# Patient Record
Sex: Female | Born: 1970 | Race: Black or African American | Hispanic: No | Smoking: Never smoker
Health system: Southern US, Community
[De-identification: ages and names within clinical notes are randomized; demographics above are authoritative.]

## PROBLEM LIST (undated history)

## (undated) DIAGNOSIS — Z8489 Family history of other specified conditions: Secondary | ICD-10-CM

## (undated) DIAGNOSIS — E079 Disorder of thyroid, unspecified: Secondary | ICD-10-CM

## (undated) DIAGNOSIS — R7303 Prediabetes: Secondary | ICD-10-CM

## (undated) DIAGNOSIS — E669 Obesity, unspecified: Secondary | ICD-10-CM

## (undated) DIAGNOSIS — Z87442 Personal history of urinary calculi: Secondary | ICD-10-CM

## (undated) DIAGNOSIS — R112 Nausea with vomiting, unspecified: Secondary | ICD-10-CM

## (undated) DIAGNOSIS — E119 Type 2 diabetes mellitus without complications: Secondary | ICD-10-CM

## (undated) DIAGNOSIS — E785 Hyperlipidemia, unspecified: Secondary | ICD-10-CM

## (undated) DIAGNOSIS — G43909 Migraine, unspecified, not intractable, without status migrainosus: Secondary | ICD-10-CM

## (undated) DIAGNOSIS — M1990 Unspecified osteoarthritis, unspecified site: Secondary | ICD-10-CM

## (undated) DIAGNOSIS — T8859XA Other complications of anesthesia, initial encounter: Secondary | ICD-10-CM

## (undated) DIAGNOSIS — K649 Unspecified hemorrhoids: Secondary | ICD-10-CM

## (undated) DIAGNOSIS — I10 Essential (primary) hypertension: Secondary | ICD-10-CM

## (undated) DIAGNOSIS — E559 Vitamin D deficiency, unspecified: Secondary | ICD-10-CM

## (undated) DIAGNOSIS — R5383 Other fatigue: Secondary | ICD-10-CM

## (undated) DIAGNOSIS — R609 Edema, unspecified: Secondary | ICD-10-CM

## (undated) DIAGNOSIS — K219 Gastro-esophageal reflux disease without esophagitis: Secondary | ICD-10-CM

## (undated) DIAGNOSIS — Z9889 Other specified postprocedural states: Secondary | ICD-10-CM

## (undated) DIAGNOSIS — T4145XA Adverse effect of unspecified anesthetic, initial encounter: Secondary | ICD-10-CM

## (undated) HISTORY — DX: Obesity, unspecified: E66.9

## (undated) HISTORY — DX: Edema, unspecified: R60.9

## (undated) HISTORY — DX: Hyperlipidemia, unspecified: E78.5

## (undated) HISTORY — PX: NASAL SINUS SURGERY: SHX719

## (undated) HISTORY — DX: Gastro-esophageal reflux disease without esophagitis: K21.9

## (undated) HISTORY — PX: TUBAL LIGATION: SHX77

## (undated) HISTORY — PX: VAGINAL HYSTERECTOMY: SUR661

## (undated) HISTORY — DX: Other fatigue: R53.83

## (undated) HISTORY — DX: Essential (primary) hypertension: I10

## (undated) HISTORY — DX: Vitamin D deficiency, unspecified: E55.9

## (undated) HISTORY — PX: CHOLECYSTECTOMY: SHX55

## (undated) HISTORY — PX: TONSILLECTOMY: SUR1361

## (undated) HISTORY — PX: NASAL SEPTUM SURGERY: SHX37

---
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
## 2005-01-12 ENCOUNTER — Emergency Department: Payer: Self-pay | Admitting: Emergency Medicine

## 2005-03-09 ENCOUNTER — Ambulatory Visit: Payer: Self-pay | Admitting: *Deleted

## 2005-10-15 ENCOUNTER — Ambulatory Visit: Payer: Self-pay | Admitting: Gynecology

## 2006-04-08 ENCOUNTER — Ambulatory Visit: Payer: Self-pay | Admitting: Gynecology

## 2007-03-14 ENCOUNTER — Ambulatory Visit: Payer: Self-pay | Admitting: Family Medicine

## 2008-03-29 ENCOUNTER — Ambulatory Visit: Payer: Self-pay

## 2008-12-10 ENCOUNTER — Ambulatory Visit: Payer: Self-pay | Admitting: Gastroenterology

## 2010-05-18 ENCOUNTER — Ambulatory Visit: Payer: Self-pay | Admitting: *Deleted

## 2011-05-15 ENCOUNTER — Ambulatory Visit: Payer: Self-pay

## 2011-07-17 ENCOUNTER — Ambulatory Visit: Payer: Self-pay

## 2012-07-09 ENCOUNTER — Ambulatory Visit: Payer: Self-pay | Admitting: Physician Assistant

## 2012-11-17 ENCOUNTER — Emergency Department: Payer: Self-pay | Admitting: Emergency Medicine

## 2012-11-17 LAB — CBC
HCT: 44 % (ref 35.0–47.0)
MCH: 28.6 pg (ref 26.0–34.0)
MCHC: 33.9 g/dL (ref 32.0–36.0)
MCV: 85 fL (ref 80–100)
Platelet: 285 10*3/uL (ref 150–440)
RBC: 5.2 10*6/uL (ref 3.80–5.20)
RDW: 14 % (ref 11.5–14.5)

## 2012-11-17 LAB — COMPREHENSIVE METABOLIC PANEL
Albumin: 3.9 g/dL (ref 3.4–5.0)
Alkaline Phosphatase: 56 U/L (ref 50–136)
Anion Gap: 2 — ABNORMAL LOW (ref 7–16)
BUN: 17 mg/dL (ref 7–18)
Bilirubin,Total: 0.4 mg/dL (ref 0.2–1.0)
Chloride: 100 mmol/L (ref 98–107)
Co2: 32 mmol/L (ref 21–32)
Creatinine: 0.95 mg/dL (ref 0.60–1.30)
EGFR (African American): >60
EGFR (Non-African Amer.): >60
Glucose: 128 mg/dL — ABNORMAL HIGH (ref 65–99)
SGOT(AST): 8 U/L — ABNORMAL LOW (ref 15–37)
SGPT (ALT): 17 U/L (ref 12–78)
Sodium: 134 mmol/L — ABNORMAL LOW (ref 136–145)
Total Protein: 7.2 g/dL (ref 6.4–8.2)

## 2012-11-17 LAB — URINALYSIS, COMPLETE
Bilirubin,UR: NEGATIVE
Blood: NEGATIVE
Glucose,UR: NEGATIVE mg/dL (ref 0–75)
Nitrite: NEGATIVE
Ph: 7 (ref 4.5–8.0)
Protein: NEGATIVE
Specific Gravity: 1.011 (ref 1.003–1.030)
WBC UR: 1 /HPF (ref 0–5)

## 2012-11-17 LAB — TSH: Thyroid Stimulating Horm: 1.25 u[IU]/mL

## 2012-11-17 LAB — TROPONIN I: Troponin-I: <0.02 ng/mL

## 2013-08-19 ENCOUNTER — Ambulatory Visit: Payer: Self-pay | Admitting: Physician Assistant

## 2013-09-23 DIAGNOSIS — J309 Allergic rhinitis, unspecified: Secondary | ICD-10-CM | POA: Insufficient documentation

## 2014-08-06 ENCOUNTER — Other Ambulatory Visit: Payer: Self-pay | Admitting: Internal Medicine

## 2014-08-06 DIAGNOSIS — Z1231 Encounter for screening mammogram for malignant neoplasm of breast: Secondary | ICD-10-CM

## 2014-08-24 ENCOUNTER — Ambulatory Visit: Payer: Self-pay | Attending: Internal Medicine

## 2014-12-30 ENCOUNTER — Ambulatory Visit: Payer: Self-pay | Admitting: Gastroenterology

## 2015-01-05 ENCOUNTER — Other Ambulatory Visit: Payer: Self-pay

## 2015-01-05 DIAGNOSIS — R739 Hyperglycemia, unspecified: Secondary | ICD-10-CM | POA: Insufficient documentation

## 2015-01-05 DIAGNOSIS — I159 Secondary hypertension, unspecified: Secondary | ICD-10-CM

## 2015-01-05 DIAGNOSIS — K219 Gastro-esophageal reflux disease without esophagitis: Secondary | ICD-10-CM

## 2015-01-05 DIAGNOSIS — I10 Essential (primary) hypertension: Secondary | ICD-10-CM | POA: Insufficient documentation

## 2015-01-05 DIAGNOSIS — R5382 Chronic fatigue, unspecified: Secondary | ICD-10-CM

## 2015-01-05 DIAGNOSIS — E669 Obesity, unspecified: Secondary | ICD-10-CM

## 2015-01-05 DIAGNOSIS — E785 Hyperlipidemia, unspecified: Secondary | ICD-10-CM

## 2015-01-05 DIAGNOSIS — R609 Edema, unspecified: Secondary | ICD-10-CM

## 2015-01-05 DIAGNOSIS — E559 Vitamin D deficiency, unspecified: Secondary | ICD-10-CM

## 2015-01-05 DIAGNOSIS — R5383 Other fatigue: Secondary | ICD-10-CM

## 2015-01-05 HISTORY — DX: Obesity, unspecified: E66.9

## 2015-01-05 HISTORY — DX: Edema, unspecified: R60.9

## 2015-01-05 HISTORY — DX: Vitamin D deficiency, unspecified: E55.9

## 2015-01-05 HISTORY — DX: Hyperlipidemia, unspecified: E78.5

## 2015-01-05 HISTORY — DX: Other fatigue: R53.83

## 2015-01-05 HISTORY — DX: Gastro-esophageal reflux disease without esophagitis: K21.9

## 2015-01-05 HISTORY — DX: Essential (primary) hypertension: I10

## 2015-01-06 ENCOUNTER — Encounter: Payer: Self-pay | Admitting: Gastroenterology

## 2015-01-06 ENCOUNTER — Other Ambulatory Visit: Payer: Self-pay

## 2015-01-06 ENCOUNTER — Ambulatory Visit (INDEPENDENT_AMBULATORY_CARE_PROVIDER_SITE_OTHER): Payer: 59 | Admitting: Gastroenterology

## 2015-01-06 ENCOUNTER — Encounter (INDEPENDENT_AMBULATORY_CARE_PROVIDER_SITE_OTHER): Payer: Self-pay

## 2015-01-06 ENCOUNTER — Ambulatory Visit: Payer: Self-pay | Admitting: Gastroenterology

## 2015-01-06 VITALS — BP 120/59 | HR 90 | Temp 98.5°F | Ht 60.0 in | Wt 234.0 lb

## 2015-01-06 DIAGNOSIS — R748 Abnormal levels of other serum enzymes: Secondary | ICD-10-CM

## 2015-01-06 DIAGNOSIS — R17 Unspecified jaundice: Secondary | ICD-10-CM

## 2015-01-17 NOTE — Progress Notes (Signed)
Office visit cancelled

## 2015-01-18 ENCOUNTER — Telehealth: Payer: Self-pay

## 2015-01-18 NOTE — Telephone Encounter (Signed)
LVM on cell her labs were normal. Advised to call back with any questions.

## 2015-03-21 ENCOUNTER — Ambulatory Visit (INDEPENDENT_AMBULATORY_CARE_PROVIDER_SITE_OTHER): Payer: 59

## 2015-03-21 ENCOUNTER — Ambulatory Visit
Admission: EM | Admit: 2015-03-21 | Discharge: 2015-03-21 | Disposition: A | Discharge status: Home / Self Care | Payer: 59 | Attending: Family Medicine | Admitting: Family Medicine

## 2015-03-21 DIAGNOSIS — K529 Noninfective gastroenteritis and colitis, unspecified: Secondary | ICD-10-CM

## 2015-03-21 HISTORY — DX: Disorder of thyroid, unspecified: E07.9

## 2015-03-21 LAB — COMPREHENSIVE METABOLIC PANEL
ALT: 14 U/L (ref 14–54)
AST: 15 U/L (ref 15–41)
Albumin: 4.3 g/dL (ref 3.5–5.0)
Alkaline Phosphatase: 55 U/L (ref 38–126)
Anion gap: 10 (ref 5–15)
BUN: 12 mg/dL (ref 6–20)
CO2: 29 mmol/L (ref 22–32)
Calcium: 10.2 mg/dL (ref 8.9–10.3)
Chloride: 97 mmol/L — ABNORMAL LOW (ref 101–111)
Creatinine, Ser: 0.81 mg/dL (ref 0.44–1.00)
GFR calc Af Amer: >60 mL/min (ref >60)
GFR calc non Af Amer: >60 mL/min (ref >60)
Glucose, Bld: 103 mg/dL — ABNORMAL HIGH (ref 65–99)
Potassium: 3.9 mmol/L (ref 3.5–5.1)
Sodium: 136 mmol/L (ref 135–145)
Total Bilirubin: 0.5 mg/dL (ref 0.3–1.2)
Total Protein: 7.3 g/dL (ref 6.5–8.1)

## 2015-03-21 LAB — CBC WITH DIFFERENTIAL/PLATELET
Basophils Absolute: 0.1 10*3/uL (ref 0–0.1)
Basophils Relative: 1 %
Eosinophils Absolute: 0.1 10*3/uL (ref 0–0.7)
Eosinophils Relative: 1 %
HCT: 44.4 % (ref 35.0–47.0)
Hemoglobin: 14.8 g/dL (ref 12.0–16.0)
Lymphocytes Relative: 28 %
Lymphs Abs: 2.1 10*3/uL (ref 1.0–3.6)
MCH: 28.6 pg (ref 26.0–34.0)
MCHC: 33.2 g/dL (ref 32.0–36.0)
MCV: 85.9 fL (ref 80.0–100.0)
Monocytes Absolute: 0.5 10*3/uL (ref 0.2–0.9)
Monocytes Relative: 6 %
Neutro Abs: 4.8 10*3/uL (ref 1.4–6.5)
Neutrophils Relative %: 64 %
Platelets: 270 10*3/uL (ref 150–440)
RBC: 5.17 MIL/uL (ref 3.80–5.20)
RDW: 13.9 % (ref 11.5–14.5)
WBC: 7.5 10*3/uL (ref 3.6–11.0)

## 2015-03-21 LAB — URINALYSIS COMPLETE WITH MICROSCOPIC (ARMC ONLY)
BILIRUBIN URINE: NEGATIVE
Glucose, UA: NEGATIVE mg/dL
KETONES UR: NEGATIVE mg/dL
Leukocytes, UA: NEGATIVE
Nitrite: NEGATIVE
PROTEIN: NEGATIVE mg/dL
Specific Gravity, Urine: >1.03 — ABNORMAL HIGH (ref 1.005–1.030)
pH: 5 (ref 5.0–8.0)

## 2015-03-21 MED ORDER — ONDANSETRON 4 MG PO TBDP
4.0000 mg | ORAL_TABLET | Freq: Three times a day (TID) | ORAL | Status: DC | PRN
Start: 2015-03-21 — End: 2016-07-12

## 2015-03-21 MED ORDER — ONDANSETRON 8 MG PO TBDP
8.0000 mg | ORAL_TABLET | Freq: Once | ORAL | Status: AC
  Administered 2015-03-21: 8 mg via ORAL

## 2015-03-21 NOTE — ED Notes (Signed)
Started 2 days before Thanksgiving with upper abdominal pain and BM's "after eating, goes right through". + nausea and yesterday began vomiting bile-vomited 5 times. Still remains nauseated.

## 2015-03-21 NOTE — ED Provider Notes (Signed)
CSN: [id number]     Arrival date & time 03/21/15  1347 History   None    Chief Complaint  Patient presents with  . Abdominal Pain   (Consider location/radiation/quality/duration/timing/severity/associated sxs/prior Treatment) HPI Comments: 44 yo female with a 6 days h/o upper abdominal pains and loose stools after eating, associated with nausea and vomiting 5 times yesterday. Denies any fevers, chills, chest pains, shortness of breath, melena, hematochezia.   The history is provided by the patient.    Past Medical History  Diagnosis Date  . Hypertension 01/05/2015  . GERD (gastroesophageal reflux disease) 01/05/2015  . Hyperlipidemia 01/05/2015  . Fatigue 01/05/2015  . Vitamin D deficiency 01/05/2015  . Obesity 01/05/2015  . Edema 01/05/2015  . Thyroid disease    Past Surgical History  Procedure Laterality Date  . Vaginal hysterectomy    . Nasal septum surgery    . Tonsillectomy    . Cholecystectomy    . Nasal sinus surgery     Family History  Problem Relation Age of Onset  . Cancer Mother   . Diabetes Mother   . Hypertension Father   . Heart disease Father   . Diabetes Sister   . Diabetes Brother   . Lupus Sister    Social History  Substance Use Topics  . Smoking status: Never Smoker   . Smokeless tobacco: Never Used  . Alcohol Use: 0.0 oz/week    0 Standard drinks or equivalent per week     Comment: rare consumption   OB History    No data available     Review of Systems  Allergies  Sulfa antibiotics  Home Medications   Prior to Admission medications   Medication Sig Start Date End Date Taking? Authorizing Provider  Cholecalciferol (VITAMIN D3) 1000 UNITS CAPS Take by mouth. 05/11/14  Yes Historical Provider, MD  esomeprazole (NEXIUM) 40 MG capsule Take by mouth.   Yes Historical Provider, MD  etodolac (LODINE) 500 MG tablet Take by mouth. 10/07/14  Yes Historical Provider, MD  hydrochlorothiazide (HYDRODIURIL) 50 MG tablet Take by mouth. 03/25/14  Yes  Historical Provider, MD  ibuprofen (ADVIL,MOTRIN) 200 MG tablet Take by mouth.   Yes Historical Provider, MD  levothyroxine (SYNTHROID, LEVOTHROID) 75 MCG tablet TK 1 T PO QD IN THE MORNING 11/16/14  Yes Historical Provider, MD  metFORMIN (GLUCOPHAGE) 500 MG tablet TK 1 T PO BID 11/16/14  Yes Historical Provider, MD  acetaminophen (TYLENOL) 500 MG tablet Take by mouth.    Historical Provider, MD  DHA-EPA-VITAMIN E PO Take by mouth. 01/27/14   Historical Provider, MD  fexofenadine (WAL-FEX ALLERGY) 180 MG tablet Take by mouth. 01/28/14   Historical Provider, MD  ondansetron (ZOFRAN ODT) 4 MG disintegrating tablet Take 1 tablet (4 mg total) by mouth every 8 (eight) hours as needed for nausea or vomiting. 03/21/15   Lutricia FeilWilliam P Roemer, PA-C   Meds Ordered and Administered this Visit   Medications  ondansetron (ZOFRAN-ODT) disintegrating tablet 8 mg (8 mg Oral Given 03/21/15 1630)    BP 130/93 mmHg  Pulse 96  Temp(Src) 97.8 F (36.6 C) (Tympanic)  Resp 16  Ht 5' (1.524 m)  Wt 240 lb (108.863 kg)  BMI 46.87 kg/m2  SpO2 100% No data found.   Physical Exam  Constitutional: She appears well-developed and well-nourished. No distress.  Cardiovascular: Normal rate, regular rhythm and normal heart sounds.   Pulmonary/Chest: Effort normal and breath sounds normal. No respiratory distress.  Abdominal: Soft. Bowel sounds are normal. She exhibits  no distension and no mass. There is tenderness (mild diffuse tenderness to palpation; no rebound or guarding; no masses). There is no rebound and no guarding.  Skin: No rash noted. She is not diaphoretic.  Nursing note and vitals reviewed.   ED Course  Procedures (including critical care time)  Labs Review Labs Reviewed  URINALYSIS COMPLETEWITH MICROSCOPIC ([facility] ONLY) - Abnormal; Notable for the following:    APPearance HAZY (*)    Specific Gravity, Urine >1.030 (*)    Hgb urine dipstick 2+ (*)    Bacteria, UA FEW (*)    Squamous Epithelial / LPF  6-30 (*)    All other components within normal limits  COMPREHENSIVE METABOLIC PANEL - Abnormal; Notable for the following:    Chloride 97 (*)    Glucose, Bld 103 (*)    All other components within normal limits  CBC WITH DIFFERENTIAL/PLATELET    Imaging Review No results found.   Visual Acuity Review  Right Eye Distance:   Left Eye Distance:   Bilateral Distance:    Right Eye Near:   Left Eye Near:    Bilateral Near:         MDM   1. Gastroenteritis, acute    Discharge Medication List as of 03/21/2015  5:05 PM    START taking these medications   Details  ondansetron (ZOFRAN ODT) 4 MG disintegrating tablet Take 1 tablet (4 mg total) by mouth every 8 (eight) hours as needed for nausea or vomiting., Starting 03/21/2015, Until Discontinued, Print       1. Lab results and diagnosis reviewed with patient 2. rx as per orders above; reviewed possible side effects, interactions, risks and benefits  3. Recommend supportive treatment with increased fluids/clear liquid diet and advance slowly as tolerated 4. Follow-up prn if symptoms worsen or don't improve    Payton Mccallum, MD 04/14/15 1651

## 2015-03-21 NOTE — Discharge Instructions (Signed)

## 2015-03-21 NOTE — ED Provider Notes (Signed)
CSN: [id number]     Arrival date & time 03/21/15  1347 History   First MD Initiated Contact with Patient 03/21/15 1557     Chief Complaint  Patient presents with  . Abdominal Pain   (Consider location/radiation/quality/duration/timing/severity/associated sxs/prior Treatment) HPI   A 44 year old female who presents with four-day history of upper abdominal pain and diarrhea. He started vomiting yesterday for 5 times. He states that the day before Thanksgiving she ate [place] food about 12 hours later began to have diarrhea is described as watery yellow and malodorous. She states that every time she tries to eat solid food she is tense to have a bowel movement. She doesn't have specific abdominal pain and has more cramping in quality prior to her bowel movement. She denies any blood or mucus in her diarrhea. She has not had any recent antibiotic therapy. She states she just does not feel well. Past abdominal surgical history includes an appendectomy hysterectomy and cholecystectomy. She denies any fever or chills. He states that she is usually able to keep liquids down but solid food is the problem.  Past Medical History  Diagnosis Date  . Hypertension 01/05/2015  . GERD (gastroesophageal reflux disease) 01/05/2015  . Hyperlipidemia 01/05/2015  . Fatigue 01/05/2015  . Vitamin D deficiency 01/05/2015  . Obesity 01/05/2015  . Edema 01/05/2015  . Thyroid disease    Past Surgical History  Procedure Laterality Date  . Vaginal hysterectomy    . Nasal septum surgery    . Tonsillectomy    . Cholecystectomy    . Nasal sinus surgery     Family History  Problem Relation Age of Onset  . Cancer Mother   . Diabetes Mother   . Hypertension Father   . Heart disease Father   . Diabetes Sister   . Diabetes Brother   . Lupus Sister    Social History  Substance Use Topics  . Smoking status: Never Smoker   . Smokeless tobacco: Never Used  . Alcohol Use: 0.0 oz/week    0 Standard drinks or equivalent  per week     Comment: rare consumption   OB History    No data available     Review of Systems  Constitutional: Positive for activity change and appetite change. Negative for fever, chills, diaphoresis and fatigue.  Gastrointestinal: Positive for nausea, vomiting, abdominal pain and diarrhea. Negative for constipation, blood in stool and abdominal distention.  Genitourinary: Negative for dysuria, urgency, frequency, hematuria, flank pain, vaginal bleeding and vaginal discharge.  All other systems reviewed and are negative.   Allergies  Sulfa antibiotics  Home Medications   Prior to Admission medications   Medication Sig Start Date End Date Taking? Authorizing Provider  Cholecalciferol (VITAMIN D3) 1000 UNITS CAPS Take by mouth. 05/11/14  Yes Historical Provider, MD  esomeprazole (NEXIUM) 40 MG capsule Take by mouth.   Yes Historical Provider, MD  etodolac (LODINE) 500 MG tablet Take by mouth. 10/07/14  Yes Historical Provider, MD  hydrochlorothiazide (HYDRODIURIL) 50 MG tablet Take by mouth. 03/25/14  Yes Historical Provider, MD  ibuprofen (ADVIL,MOTRIN) 200 MG tablet Take by mouth.   Yes Historical Provider, MD  levothyroxine (SYNTHROID, LEVOTHROID) 75 MCG tablet TK 1 T PO QD IN THE MORNING 11/16/14  Yes Historical Provider, MD  metFORMIN (GLUCOPHAGE) 500 MG tablet TK 1 T PO BID 11/16/14  Yes Historical Provider, MD  acetaminophen (TYLENOL) 500 MG tablet Take by mouth.    Historical Provider, MD  DHA-EPA-VITAMIN E PO Take by mouth.  01/27/14   Historical Provider, MD  fexofenadine (WAL-FEX ALLERGY) 180 MG tablet Take by mouth. 01/28/14   Historical Provider, MD  ondansetron (ZOFRAN ODT) 4 MG disintegrating tablet Take 1 tablet (4 mg total) by mouth every 8 (eight) hours as needed for nausea or vomiting. 03/21/15   Lutricia Feil, PA-C   Meds Ordered and Administered this Visit   Medications  ondansetron (ZOFRAN-ODT) disintegrating tablet 8 mg (8 mg Oral Given 03/21/15 1630)    BP  130/93 mmHg  Pulse 96  Temp(Src) 97.8 F (36.6 C) (Tympanic)  Resp 16  Ht 5' (1.524 m)  Wt 240 lb (108.863 kg)  BMI 46.87 kg/m2  SpO2 100% No data found.   Physical Exam  Constitutional: She is oriented to person, place, and time. She appears well-developed and well-nourished. No distress.  HENT:  Head: Normocephalic and atraumatic.  Eyes: Conjunctivae are normal. Pupils are equal, round, and reactive to light. Right eye exhibits no discharge. Left eye exhibits no discharge.  Neck: Neck supple.  Pulmonary/Chest: Breath sounds normal. No respiratory distress. She has no wheezes. She has no rales.  Abdominal: Soft. Bowel sounds are normal. She exhibits no distension. There is tenderness. There is no rebound and no guarding.  Juanna Cao chaperoned. No distension. NBS present. Very diffuse tenderness to deep palpation. No rebound or guarding. Zofran ODT helped nausea  Musculoskeletal: Normal range of motion. She exhibits no edema or tenderness.  Neurological: She is alert and oriented to person, place, and time.  Skin: Skin is warm and dry. She is not diaphoretic.  Psychiatric: She has a normal mood and affect. Her behavior is normal. Judgment and thought content normal.    ED Course  Procedures (including critical care time)  Labs Review Labs Reviewed  URINALYSIS COMPLETEWITH MICROSCOPIC ([facility] ONLY) - Abnormal; Notable for the following:    APPearance HAZY (*)    Specific Gravity, Urine >1.030 (*)    Hgb urine dipstick 2+ (*)    Bacteria, UA FEW (*)    Squamous Epithelial / LPF 6-30 (*)    All other components within normal limits  COMPREHENSIVE METABOLIC PANEL - Abnormal; Notable for the following:    Chloride 97 (*)    Glucose, Bld 103 (*)    All other components within normal limits  CBC WITH DIFFERENTIAL/PLATELET    Imaging Review Dg Abd Acute W/chest  03/21/2015  CLINICAL DATA:  Pain in LUQ x 1 week with diarrhea and n/v and bloating. Hx gastritis and GERD.  Surgeries, appendectomy, Cholecystectomy, hysterectomy EXAM: DG ABDOMEN ACUTE W/ 1V CHEST COMPARISON:  05/15/2011 FINDINGS: Normal bowel gas pattern with no bowel dilation to suggest obstruction or generalized adynamic ileus. No air-fluid levels. There is no free air. Surgical vascular clips in the right upper quadrant reflect a prior cholecystectomy. No evidence of renal or ureteral stones. Soft tissues are otherwise unremarkable. Normal heart, mediastinum and hila.  Clear lungs. IMPRESSION: 1. No acute findings. No evidence of bowel obstruction or generalized adynamic ileus. No free air. 2. Normal frontal chest radiograph. Electronically Signed   By: Amie Portland M.D.   On: 03/21/2015 16:48     Visual Acuity Review  Right Eye Distance:   Left Eye Distance:   Bilateral Distance:    Right Eye Near:   Left Eye Near:    Bilateral Near:         MDM   1. Gastroenteritis, acute    Discharge Medication List as of 03/21/2015  5:05 PM  START taking these medications   Details  ondansetron (ZOFRAN ODT) 4 MG disintegrating tablet Take 1 tablet (4 mg total) by mouth every 8 (eight) hours as needed for nausea or vomiting., Starting 03/21/2015, Until Discontinued, Print      Plan: 1. Test/x-ray results and diagnosis reviewed with patient 2. rx as per orders; risks, benefits, potential side effects reviewed with patient 3. Recommend supportive treatment with Fluids rest. Follow BRAT diet when starting food intake 4. F/u  With PCP next week or go to ED if worsening   Lutricia FeilWilliam P Roemer, PA-C 03/22/15 1706   Lutricia FeilWilliam P Roemer, PA-C 03/22/15 1715

## 2015-05-30 ENCOUNTER — Other Ambulatory Visit: Payer: Self-pay | Admitting: Internal Medicine

## 2015-05-30 ENCOUNTER — Ambulatory Visit
Admission: RE | Admit: 2015-05-30 | Discharge: 2015-05-30 | Disposition: A | Discharge status: Home / Self Care | Payer: 59 | Source: Ambulatory Visit | Attending: Internal Medicine | Admitting: Internal Medicine

## 2015-05-30 DIAGNOSIS — R109 Unspecified abdominal pain: Secondary | ICD-10-CM | POA: Diagnosis present

## 2015-05-30 DIAGNOSIS — M545 Low back pain: Secondary | ICD-10-CM

## 2015-05-30 DIAGNOSIS — N83202 Unspecified ovarian cyst, left side: Secondary | ICD-10-CM | POA: Insufficient documentation

## 2015-05-30 DIAGNOSIS — Z90721 Acquired absence of ovaries, unilateral: Secondary | ICD-10-CM | POA: Insufficient documentation

## 2015-05-30 DIAGNOSIS — Z90710 Acquired absence of both cervix and uterus: Secondary | ICD-10-CM | POA: Diagnosis not present

## 2015-06-21 ENCOUNTER — Other Ambulatory Visit: Payer: Self-pay | Admitting: Internal Medicine

## 2015-06-21 DIAGNOSIS — R1031 Right lower quadrant pain: Secondary | ICD-10-CM

## 2015-06-26 ENCOUNTER — Emergency Department: Payer: 59

## 2015-06-26 ENCOUNTER — Emergency Department
Admission: EM | Admit: 2015-06-26 | Discharge: 2015-06-26 | Disposition: A | Discharge status: Home / Self Care | Payer: 59 | Attending: Emergency Medicine | Admitting: Emergency Medicine

## 2015-06-26 ENCOUNTER — Other Ambulatory Visit: Payer: Self-pay

## 2015-06-26 DIAGNOSIS — E669 Obesity, unspecified: Secondary | ICD-10-CM | POA: Diagnosis not present

## 2015-06-26 DIAGNOSIS — M79604 Pain in right leg: Secondary | ICD-10-CM | POA: Insufficient documentation

## 2015-06-26 DIAGNOSIS — I10 Essential (primary) hypertension: Secondary | ICD-10-CM | POA: Insufficient documentation

## 2015-06-26 DIAGNOSIS — R079 Chest pain, unspecified: Secondary | ICD-10-CM | POA: Diagnosis present

## 2015-06-26 DIAGNOSIS — Z7984 Long term (current) use of oral hypoglycemic drugs: Secondary | ICD-10-CM | POA: Insufficient documentation

## 2015-06-26 DIAGNOSIS — M79605 Pain in left leg: Secondary | ICD-10-CM | POA: Insufficient documentation

## 2015-06-26 DIAGNOSIS — Z791 Long term (current) use of non-steroidal anti-inflammatories (NSAID): Secondary | ICD-10-CM | POA: Diagnosis not present

## 2015-06-26 DIAGNOSIS — M79622 Pain in left upper arm: Secondary | ICD-10-CM | POA: Diagnosis not present

## 2015-06-26 DIAGNOSIS — Z79899 Other long term (current) drug therapy: Secondary | ICD-10-CM | POA: Diagnosis not present

## 2015-06-26 LAB — TROPONIN I

## 2015-06-26 LAB — BASIC METABOLIC PANEL
ANION GAP: 7 (ref 5–15)
BUN: 22 mg/dL — ABNORMAL HIGH (ref 6–20)
CALCIUM: 9.7 mg/dL (ref 8.9–10.3)
CO2: 27 mmol/L (ref 22–32)
Chloride: 103 mmol/L (ref 101–111)
Creatinine, Ser: 0.81 mg/dL (ref 0.44–1.00)
Glucose, Bld: 99 mg/dL (ref 65–99)
POTASSIUM: 3.8 mmol/L (ref 3.5–5.1)
SODIUM: 137 mmol/L (ref 135–145)

## 2015-06-26 LAB — CBC
HEMATOCRIT: 40.9 % (ref 35.0–47.0)
HEMOGLOBIN: 13.8 g/dL (ref 12.0–16.0)
MCH: 28.6 pg (ref 26.0–34.0)
MCHC: 33.6 g/dL (ref 32.0–36.0)
MCV: 85.1 fL (ref 80.0–100.0)
Platelets: 254 10*3/uL (ref 150–440)
RBC: 4.8 MIL/uL (ref 3.80–5.20)
RDW: 13.7 % (ref 11.5–14.5)
WBC: 6.6 10*3/uL (ref 3.6–11.0)

## 2015-06-26 MED ORDER — MORPHINE SULFATE (PF) 4 MG/ML IV SOLN
4.0000 mg | Freq: Once | INTRAVENOUS | Status: AC
  Administered 2015-06-26: 4 mg via INTRAVENOUS
  Filled 2015-06-26: qty 1

## 2015-06-26 MED ORDER — IOHEXOL 350 MG/ML SOLN
75.0000 mL | Freq: Once | INTRAVENOUS | Status: AC | PRN
Start: 2015-06-26 — End: 2015-06-26
  Administered 2015-06-26: 75 mL via INTRAVENOUS

## 2015-06-26 MED ORDER — SODIUM CHLORIDE 0.9 % IV BOLUS (SEPSIS)
500.0000 mL | INTRAVENOUS | Status: AC
  Administered 2015-06-26: 500 mL via INTRAVENOUS

## 2015-06-26 MED ORDER — ONDANSETRON HCL 4 MG/2ML IJ SOLN
4.0000 mg | Freq: Once | INTRAMUSCULAR | Status: AC
  Administered 2015-06-26: 4 mg via INTRAVENOUS
  Filled 2015-06-26: qty 2

## 2015-06-26 MED ORDER — ASPIRIN 81 MG PO CHEW
162.0000 mg | CHEWABLE_TABLET | Freq: Once | ORAL | Status: AC
  Administered 2015-06-26: 162 mg via ORAL

## 2015-06-26 MED ORDER — ASPIRIN 81 MG PO CHEW
324.0000 mg | CHEWABLE_TABLET | Freq: Once | ORAL | Status: DC
  Filled 2015-06-26: qty 4

## 2015-06-26 NOTE — ED Notes (Signed)
Discussed discharge instructions and follow-up care with patient. No questions or concerns at this time. Pt stable at discharge.  

## 2015-06-26 NOTE — Discharge Instructions (Signed)
You have been seen in the Emergency Department (ED) today for chest pain.  As we have discussed todays test results are normal, but you may require further testing.  Please follow up with the recommended doctor as instructed above in these documents (tomorrow at 2 pm) regarding todays emergent visit and your recent symptoms to discuss further management.  Continue to take your regular medications. If you are not doing so already, please also take a daily baby aspirin (81 mg), at least until you follow up with your doctor.  Return to the Emergency Department (ED) if you experience any further chest pain/pressure/tightness, difficulty breathing, or sudden sweating, or other symptoms that concern you.   Chest Pain (Nonspecific) It is often hard to give a specific diagnosis for the cause of chest pain. There is always a chance that your pain could be related to something serious, such as a heart attack or a blood clot in the lungs. You need to follow up with your health care provider for further evaluation. CAUSES   Heartburn.  Pneumonia or bronchitis.  Anxiety or stress.  Inflammation around your heart (pericarditis) or lung (pleuritis or pleurisy).  A blood clot in the lung.  A collapsed lung (pneumothorax). It can develop suddenly on its own (spontaneous pneumothorax) or from trauma to the chest.  Shingles infection (herpes zoster virus). The chest wall is composed of bones, muscles, and cartilage. Any of these can be the source of the pain.  The bones can be bruised by injury.  The muscles or cartilage can be strained by coughing or overwork.  The cartilage can be affected by inflammation and become sore (costochondritis). DIAGNOSIS  Lab tests or other studies may be needed to find the cause of your pain. Your health care provider may have you take a test called an ambulatory electrocardiogram (ECG). An ECG records your heartbeat patterns over a 24-hour period. You may also have  other tests, such as:  Transthoracic echocardiogram (TTE). During echocardiography, sound waves are used to evaluate how blood flows through your heart.  Transesophageal echocardiogram (TEE).  Cardiac monitoring. This allows your health care provider to monitor your heart rate and rhythm in real time.  Holter monitor. This is a portable device that records your heartbeat and can help diagnose heart arrhythmias. It allows your health care provider to track your heart activity for several days, if needed.  Stress tests by exercise or by giving medicine that makes the heart beat faster. TREATMENT   Treatment depends on what may be causing your chest pain. Treatment may include:  Acid blockers for heartburn.  Anti-inflammatory medicine.  Pain medicine for inflammatory conditions.  Antibiotics if an infection is present.  You may be advised to change lifestyle habits. This includes stopping smoking and avoiding alcohol, caffeine, and chocolate.  You may be advised to keep your head raised (elevated) when sleeping. This reduces the chance of acid going backward from your stomach into your esophagus. Most of the time, nonspecific chest pain will improve within 2-3 days with rest and mild pain medicine.  HOME CARE INSTRUCTIONS   If antibiotics were prescribed, take them as directed. Finish them even if you start to feel better.  For the next few days, avoid physical activities that bring on chest pain. Continue physical activities as directed.  Do not use any tobacco products, including cigarettes, chewing tobacco, or electronic cigarettes.  Avoid drinking alcohol.  Only take medicine as directed by your health care provider.  Follow your health  care provider's suggestions for further testing if your chest pain does not go away.  Keep any follow-up appointments you made. If you do not go to an appointment, you could develop lasting (chronic) problems with pain. If there is any  problem keeping an appointment, call to reschedule. SEEK MEDICAL CARE IF:   Your chest pain does not go away, even after treatment.  You have a rash with blisters on your chest.  You have a fever. SEEK IMMEDIATE MEDICAL CARE IF:   You have increased chest pain or pain that spreads to your arm, neck, jaw, back, or abdomen.  You have shortness of breath.  You have an increasing cough, or you cough up blood.  You have severe back or abdominal pain.  You feel nauseous or vomit.  You have severe weakness.  You faint.  You have chills. This is an emergency. Do not wait to see if the pain will go away. Get medical help at once. Call your local emergency services (911 in [place] Do not drive yourself to the hospital. MAKE SURE YOU:   Understand these instructions.  Will watch your condition.  Will get help right away if you are not doing well or get worse. Document Released: 01/17/2005 Document Revised: 04/14/2013 Document Reviewed: 11/13/2007 [facility] Patient Information 2015 [place], [place]. This information is not intended to replace advice given to you by your health care provider. Make sure you discuss any questions you have with your health care provider.

## 2015-06-26 NOTE — ED Notes (Signed)
Patient transported to Ultrasound 

## 2015-06-26 NOTE — ED Notes (Signed)
Patient transported to CT 

## 2015-06-26 NOTE — ED Provider Notes (Signed)
[facility] Emergency Department Provider Note  ____________________________________________  Time seen: Approximately 4:03 AM  I have reviewed the triage vital signs and the nursing notes.   HISTORY  Chief Complaint Chest Pain    HPI Tamara Flowers is a 45 y.o. female with a PMH of obesity, HTN, HLD, and acid reflux who presents with acute onset of moderate left sided chest pain radiating to her back and down left arm.  She reports she was asleep and that the pain woke her up.  It has been constant since it started a few hours ago.  Nothing makes it better nor worse.  It is sharp and stabbing.   Never had similar symptoms.  Denies N/V, abd pain, SOB.  Has some focal swelling in left upper arm just above elbow on the medial side which is new.  No exogenous estrogen.  No history of blood clots.  Father had early onset ACS/CAD and died from a heart attack.  No personal history of CAD.   Past Medical History  Diagnosis Date  . Hypertension 01/05/2015  . GERD (gastroesophageal reflux disease) 01/05/2015  . Hyperlipidemia 01/05/2015  . Fatigue 01/05/2015  . Vitamin D deficiency 01/05/2015  . Obesity 01/05/2015  . Edema 01/05/2015  . Thyroid disease     Patient Active Problem List   Diagnosis Date Noted  . Hyperglycemia 01/05/2015  . Hyperlipidemia 01/05/2015  . Hypertension 01/05/2015  . Fatigue 01/05/2015  . Vitamin D deficiency 01/05/2015  . GERD (gastroesophageal reflux disease) 01/05/2015  . Obesity 01/05/2015  . Edema 01/05/2015    Past Surgical History  Procedure Laterality Date  . Vaginal hysterectomy    . Nasal septum surgery    . Tonsillectomy    . Cholecystectomy    . Nasal sinus surgery      Current Outpatient Rx  Name  Route  Sig  Dispense  Refill  . acetaminophen (TYLENOL) 500 MG tablet   Oral   Take by mouth.         . Cholecalciferol (VITAMIN D3) 1000 UNITS CAPS   Oral   Take by mouth.         . DHA-EPA-VITAMIN E PO    Oral   Take by mouth.         . esomeprazole (NEXIUM) 40 MG capsule   Oral   Take by mouth.         . etodolac (LODINE) 500 MG tablet   Oral   Take by mouth.         . fexofenadine (WAL-FEX ALLERGY) 180 MG tablet   Oral   Take by mouth.         . hydrochlorothiazide (HYDRODIURIL) 50 MG tablet   Oral   Take by mouth.         Marland Kitchen ibuprofen (ADVIL,MOTRIN) 200 MG tablet   Oral   Take by mouth.         . levothyroxine (SYNTHROID, LEVOTHROID) 75 MCG tablet      TK 1 T PO QD IN THE MORNING      3   . metFORMIN (GLUCOPHAGE) 500 MG tablet      TK 1 T PO BID      1   . ondansetron (ZOFRAN ODT) 4 MG disintegrating tablet   Oral   Take 1 tablet (4 mg total) by mouth every 8 (eight) hours as needed for nausea or vomiting.   20 tablet   0     Allergies Sulfa antibiotics  Family History  Problem Relation Age of Onset  . Cancer Mother   . Diabetes Mother   . Hypertension Father   . Heart disease Father   . Diabetes Sister   . Diabetes Brother   . Lupus Sister     Social History Social History  Substance Use Topics  . Smoking status: Never Smoker   . Smokeless tobacco: Never Used  . Alcohol Use: 0.0 oz/week    0 Standard drinks or equivalent per week     Comment: rare consumption    Review of Systems Constitutional: No fever/chills Eyes: No visual changes. ENT: No sore throat. Cardiovascular: Moderate L sided chest pain radiating to back and left arm Respiratory: Denies shortness of breath. Gastrointestinal: No abdominal pain.  No nausea, no vomiting.  No diarrhea.  No constipation. Genitourinary: Negative for dysuria. Musculoskeletal: Pain in left upper arm.  Some mild bilateral leg pain with ambulation, but not like the arm discomfort. Skin: Negative for rash. Neurological: Negative for headaches, focal weakness or numbness.  10-point ROS otherwise negative.  ____________________________________________   PHYSICAL EXAM:  VITAL  SIGNS: ED Triage Vitals  Enc Vitals Group     BP 06/26/15 0045 127/87 mmHg     Pulse Rate 06/26/15 0045 97     Resp 06/26/15 0045 22     Temp 06/26/15 0045 98.3 F (36.8 C)     Temp Source 06/26/15 0045 Oral     SpO2 06/26/15 0045 100 %     Weight 06/26/15 0043 230 lb (104.327 kg)     Height 06/26/15 0043 5' (1.524 m)     Head Cir --      Peak Flow --      Pain Score 06/26/15 0043 6     Pain Loc --      Pain Edu? --      Excl. in GC? --     Constitutional: Alert and oriented. No severe distress, but appears uncomfortable. Eyes: Conjunctivae are normal. PERRL. EOMI. Head: Atraumatic. Nose: No congestion/rhinnorhea. Mouth/Throat: Mucous membranes are moist.  Oropharynx non-erythematous. Neck: No stridor.   Cardiovascular: Normal rate, regular rhythm. Grossly normal heart sounds.  Good peripheral circulation. No reproducible chest wall tenderness Respiratory: Normal respiratory effort.  No retractions. Lungs CTAB. Gastrointestinal: Obese, Soft and nontender. No distention. No abdominal bruits. No CVA tenderness. Musculoskeletal: Focal area of swelling and tenderness on her left upper extremity just proximal to the elbow on the medial side, does not appear consistent with abscess or infectious process.  No lower extremity pitting edema.  No significant tenderness of the lower extremity. Neurologic:  Normal speech and language. No gross focal neurologic deficits are appreciated.  Skin:  Skin is warm, dry and intact. No rash noted. Psychiatric: Mood and affect are normal. Speech and behavior are normal.  ____________________________________________   LABS (all labs ordered are listed, but only abnormal results are displayed)  Labs Reviewed  BASIC METABOLIC PANEL - Abnormal; Notable for the following:    BUN 22 (*)    All other components within normal limits  CBC  TROPONIN I  TROPONIN I   ____________________________________________  EKG  ED ECG REPORT I, Gurnoor Ursua,  the attending physician, personally viewed and interpreted this ECG.  Date: 06/26/2015 EKG Time: 00:48 Rate: 96 Rhythm: normal sinus rhythm QRS Axis: normal Intervals: normal ST/T Wave abnormalities: normal Conduction Disturbances: none Narrative Interpretation: unremarkable  ____________________________________________  RADIOLOGY   Dg Chest 2 View  06/26/2015  CLINICAL DATA:  Acute onset  of left-sided chest pain, radiating to the left jaw and left arm. Initial encounter. EXAM: CHEST  2 VIEW COMPARISON:  Chest radiograph performed 03/21/2015 FINDINGS: The lungs are well-aerated and clear. There is no evidence of focal opacification, pleural effusion or pneumothorax. The heart is normal in size; the mediastinal contour is within normal limits. No acute osseous abnormalities are seen. Clips are noted within the right upper quadrant, reflecting prior cholecystectomy. IMPRESSION: No acute cardiopulmonary process seen. Electronically Signed   By: Roanna Raider M.D.   On: 06/26/2015 01:24   Ct Angio Chest Pe W/cm &/or Wo Cm  06/26/2015  CLINICAL DATA:  Acute onset of left-sided chest pain, radiating to the jaw and left arm. Initial encounter. EXAM: CT ANGIOGRAPHY CHEST WITH CONTRAST TECHNIQUE: Multidetector CT imaging of the chest was performed using the standard protocol during bolus administration of intravenous contrast. Multiplanar CT image reconstructions and MIPs were obtained to evaluate the vascular anatomy. CONTRAST:  75mL OMNIPAQUE IOHEXOL 350 MG/ML SOLN COMPARISON:  Chest radiograph performed earlier today at 12:27 a.m. FINDINGS: There is no evidence of pulmonary embolus. Minimal bibasilar atelectasis is noted. The lungs are otherwise clear. There is no evidence of significant focal consolidation, pleural effusion or pneumothorax. No masses are identified; no abnormal focal contrast enhancement is seen. The mediastinum is unremarkable in appearance. No mediastinal lymphadenopathy is seen. No  pericardial effusion is identified. The great vessels are grossly unremarkable, aside from mild ectasia of the right innominate artery. No axillary lymphadenopathy is seen. The visualized portions of the thyroid gland are unremarkable in appearance. The visualized portions of the liver and spleen are unremarkable. The patient is status post cholecystectomy, with clips noted at the gallbladder fossa. The visualized portions of the pancreas, right adrenal gland and stomach are grossly unremarkable. No acute osseous abnormalities are seen. Review of the MIP images confirms the above findings. IMPRESSION: 1. No evidence of pulmonary embolus. 2. Minimal bibasilar atelectasis noted.  Lungs otherwise clear. Electronically Signed   By: Roanna Raider M.D.   On: 06/26/2015 05:00   US Venous Img Upper Uni Left  06/26/2015  CLINICAL DATA:  Pain and swelling about the left upper arm, acute onset. Initial encounter. EXAM: LEFT UPPER EXTREMITY VENOUS DOPPLER ULTRASOUND TECHNIQUE: Gray-scale sonography with graded compression, as well as color Doppler and duplex ultrasound were performed to evaluate the upper extremity deep venous system from the level of the subclavian vein and including the jugular, axillary, basilic, radial, ulnar and upper cephalic vein. Spectral Doppler was utilized to evaluate flow at rest and with distal augmentation maneuvers. COMPARISON:  None. FINDINGS: Contralateral Subclavian Vein: Respiratory phasicity is normal and symmetric with the symptomatic side. No evidence of thrombus. Normal compressibility. Internal Jugular Vein: No evidence of thrombus. Normal compressibility, respiratory phasicity and response to augmentation. Subclavian Vein: No evidence of thrombus. Normal compressibility, respiratory phasicity and response to augmentation. Axillary Vein: No evidence of thrombus. Normal compressibility, respiratory phasicity and response to augmentation. Cephalic Vein: No evidence of thrombus. Normal  compressibility, respiratory phasicity and response to augmentation. Basilic Vein: No evidence of thrombus. Normal compressibility, respiratory phasicity and response to augmentation. Brachial Veins: No evidence of thrombus. Normal compressibility, respiratory phasicity and response to augmentation. Radial Veins: No evidence of thrombus. Normal compressibility, respiratory phasicity and response to augmentation. Ulnar Veins: No evidence of thrombus. Normal compressibility, respiratory phasicity and response to augmentation. Venous Reflux:  None visualized. Other Findings:  None visualized. IMPRESSION: No evidence of deep venous thrombosis. Electronically Signed   By:  Roanna Raider M.D.   On: 06/26/2015 06:06    ____________________________________________   PROCEDURES  Procedure(s) performed: None  Critical Care performed: No ____________________________________________   INITIAL IMPRESSION / ASSESSMENT AND PLAN / ED COURSE  Pertinent labs & imaging results that were available during my care of the patient were reviewed by me and considered in my medical decision making (see chart for details).  Initially I was concerned about the possibility of ACS versus PE versus dissection.  However the patient is hemodynamically stable and does not appear to be in any acute distress.  Her initial troponin is negative and after speaking with her we agreed to proceed with a CT angio chest to rule out a PE as well as obtaining an ultrasound of her left upper extremity given the odd area of tenderness just proximal to the elbow.  Both of these studies have come back negative and reassuring.  The patient feels much better at this time.  She does have some risk factors for ACS but her HEART score is approximately 4 which could be appropriate either for chest pain observation or close outpatient management.  Fortunately at [facility] where able to achieve close outpatient follow-up.  I offered admission to the  patient but explained that close outpatient follow-up should be acceptable and she does prefer to go home and follow up in clinic.  Repeat a second troponin for further risk stratification.  I attempted to contact Dr. Adrian Blackwater by phone twice to discuss the case and set up close follow up, but he did not answer his phone or voicemail.  I sent a message through [facility] to him, and encouraged the patient to call on Monday morning and explain she needs a visit either that day or the next.  Transferring ED care to Dr. Inocencio Homes to follow up troponin and disposition appropriately.  ____________________________________________  FINAL CLINICAL IMPRESSION(S) / ED DIAGNOSES  Final diagnoses:  Chest pain, unspecified chest pain type      NEW MEDICATIONS STARTED DURING THIS VISIT:  New Prescriptions   No medications on file      Note:  This document was prepared using Dragon voice recognition software and may include unintentional dictation errors.   Loleta Rose, MD 06/26/15 [phone]

## 2015-06-26 NOTE — ED Notes (Signed)
Patient reports left sided chest pain that radiates up into left jaw and into left arm for approximately 30 minutes.  Reports feels like indigestion, but took something and it did not help.

## 2015-06-28 ENCOUNTER — Ambulatory Visit
Admission: RE | Admit: 2015-06-28 | Discharge: 2015-06-28 | Disposition: A | Discharge status: Home / Self Care | Payer: 59 | Source: Ambulatory Visit | Attending: Internal Medicine | Admitting: Internal Medicine

## 2015-06-28 DIAGNOSIS — M5137 Other intervertebral disc degeneration, lumbosacral region: Secondary | ICD-10-CM | POA: Diagnosis not present

## 2015-06-28 DIAGNOSIS — R109 Unspecified abdominal pain: Secondary | ICD-10-CM | POA: Insufficient documentation

## 2015-06-28 DIAGNOSIS — R1031 Right lower quadrant pain: Secondary | ICD-10-CM | POA: Insufficient documentation

## 2015-06-28 DIAGNOSIS — M5136 Other intervertebral disc degeneration, lumbar region: Secondary | ICD-10-CM | POA: Diagnosis not present

## 2015-06-28 HISTORY — DX: Type 2 diabetes mellitus without complications: E11.9

## 2015-06-28 MED ORDER — IOHEXOL 350 MG/ML SOLN
100.0000 mL | Freq: Once | INTRAVENOUS | Status: AC | PRN
  Administered 2015-06-28: 100 mL via INTRAVENOUS

## 2016-02-08 DIAGNOSIS — G8929 Other chronic pain: Secondary | ICD-10-CM | POA: Insufficient documentation

## 2016-02-08 DIAGNOSIS — M2550 Pain in unspecified joint: Secondary | ICD-10-CM | POA: Insufficient documentation

## 2016-02-08 DIAGNOSIS — Z6841 Body Mass Index (BMI) 40.0 and over, adult: Secondary | ICD-10-CM | POA: Insufficient documentation

## 2016-02-09 ENCOUNTER — Other Ambulatory Visit: Payer: Self-pay | Admitting: Internal Medicine

## 2016-02-09 DIAGNOSIS — G8929 Other chronic pain: Secondary | ICD-10-CM

## 2016-02-09 DIAGNOSIS — M5441 Lumbago with sciatica, right side: Principal | ICD-10-CM

## 2016-02-21 ENCOUNTER — Ambulatory Visit
Admission: RE | Admit: 2016-02-21 | Discharge: 2016-02-21 | Disposition: A | Discharge status: Home / Self Care | Payer: 59 | Source: Ambulatory Visit | Attending: Internal Medicine | Admitting: Internal Medicine

## 2016-02-21 DIAGNOSIS — M5126 Other intervertebral disc displacement, lumbar region: Secondary | ICD-10-CM | POA: Diagnosis not present

## 2016-02-21 DIAGNOSIS — M549 Dorsalgia, unspecified: Secondary | ICD-10-CM | POA: Insufficient documentation

## 2016-02-21 DIAGNOSIS — M5441 Lumbago with sciatica, right side: Secondary | ICD-10-CM | POA: Insufficient documentation

## 2016-02-21 DIAGNOSIS — G8929 Other chronic pain: Secondary | ICD-10-CM | POA: Diagnosis present

## 2016-07-03 ENCOUNTER — Emergency Department
Admission: EM | Admit: 2016-07-03 | Discharge: 2016-07-03 | Disposition: A | Discharge status: Home / Self Care | Payer: 59 | Attending: Emergency Medicine | Admitting: Emergency Medicine

## 2016-07-03 ENCOUNTER — Encounter: Payer: Self-pay | Admitting: Emergency Medicine

## 2016-07-03 DIAGNOSIS — Z7984 Long term (current) use of oral hypoglycemic drugs: Secondary | ICD-10-CM | POA: Diagnosis not present

## 2016-07-03 DIAGNOSIS — Z79899 Other long term (current) drug therapy: Secondary | ICD-10-CM | POA: Diagnosis not present

## 2016-07-03 DIAGNOSIS — K649 Unspecified hemorrhoids: Secondary | ICD-10-CM | POA: Insufficient documentation

## 2016-07-03 DIAGNOSIS — E119 Type 2 diabetes mellitus without complications: Secondary | ICD-10-CM | POA: Diagnosis not present

## 2016-07-03 DIAGNOSIS — I10 Essential (primary) hypertension: Secondary | ICD-10-CM | POA: Diagnosis not present

## 2016-07-03 MED ORDER — LIDOCAINE HCL 2 % EX GEL
1.0000 "application " | Freq: Once | CUTANEOUS | Status: AC
  Administered 2016-07-03: 1 via TOPICAL

## 2016-07-03 MED ORDER — LIDOCAINE HCL 2 % EX GEL
CUTANEOUS | Status: AC
  Filled 2016-07-03: qty 10

## 2016-07-03 MED ORDER — PHENYLEPHRINE-COCOA BUTTER 0.25-88.44 % RE SUPP: 1.0000 | RECTAL | 0 refills | Status: DC | PRN

## 2016-07-03 MED ORDER — HYDROCORTISONE 2.5 % RE CREA: TOPICAL_CREAM | RECTAL | 1 refills | Status: AC

## 2016-07-03 MED ORDER — LIDOCAINE (ANORECTAL) 5 % EX CREA: TOPICAL_CREAM | CUTANEOUS | 0 refills | Status: DC

## 2016-07-03 NOTE — ED Notes (Signed)

## 2016-07-03 NOTE — ED Triage Notes (Signed)
Patient presents to ED via POV with c/o hemorrhoids since Wednesday. Patient here for pain relief. Patient states she has attempted creams and sitz baths. A&O x4.

## 2016-07-03 NOTE — ED Provider Notes (Addendum)
[facility] Emergency Department Provider Note  ____________________________________________   I have reviewed the triage vital signs and the nursing notes.   HISTORY  Chief Complaint Hemorrhoids    HPI Tamara Flowers is a 46 y.o. female with a history of recurrent hemorrhoids presents today with hemorrhoid pain. She's had rectal pain since Wednesday. She is able to feel the actual hemorrhoid. She states she is using sitz baths and stool softeners and still having pain. She denies any fever or chills. No constipation or blood in her stool. She denies any rectal trauma. She denies a rectal foreign body. She has had them last before. This feels exactly a prior hemorrhoids. No bleeding.       Past Medical History:  Diagnosis Date  . Diabetes mellitus without complication (HCC)   . Edema 01/05/2015  . Fatigue 01/05/2015  . GERD (gastroesophageal reflux disease) 01/05/2015  . Hyperlipidemia 01/05/2015  . Hypertension 01/05/2015  . Obesity 01/05/2015  . Thyroid disease   . Vitamin D deficiency 01/05/2015    Patient Active Problem List   Diagnosis Date Noted  . Hyperglycemia 01/05/2015  . Hyperlipidemia 01/05/2015  . Hypertension 01/05/2015  . Fatigue 01/05/2015  . Vitamin D deficiency 01/05/2015  . GERD (gastroesophageal reflux disease) 01/05/2015  . Obesity 01/05/2015  . Edema 01/05/2015    Past Surgical History:  Procedure Laterality Date  . CHOLECYSTECTOMY    . NASAL SEPTUM SURGERY    . NASAL SINUS SURGERY    . TONSILLECTOMY    . VAGINAL HYSTERECTOMY      Prior to Admission medications   Medication Sig Start Date End Date Taking? Authorizing Provider  acetaminophen (TYLENOL) 500 MG tablet Take by mouth.    Historical Provider, MD  Cholecalciferol (VITAMIN D3) 1000 UNITS CAPS Take by mouth. 05/11/14   Historical Provider, MD  DHA-EPA-VITAMIN E PO Take by mouth. 01/27/14   Historical Provider, MD  esomeprazole (NEXIUM) 40 MG capsule Take by  mouth.    Historical Provider, MD  etodolac (LODINE) 500 MG tablet Take by mouth. 10/07/14   Historical Provider, MD  fexofenadine (WAL-FEX ALLERGY) 180 MG tablet Take by mouth. 01/28/14   Historical Provider, MD  hydrochlorothiazide (HYDRODIURIL) 50 MG tablet Take by mouth. 03/25/14   Historical Provider, MD  ibuprofen (ADVIL,MOTRIN) 200 MG tablet Take by mouth.    Historical Provider, MD  levothyroxine (SYNTHROID, LEVOTHROID) 75 MCG tablet TK 1 T PO QD IN THE MORNING 11/16/14   Historical Provider, MD  metFORMIN (GLUCOPHAGE) 500 MG tablet TK 1 T PO BID 11/16/14   Historical Provider, MD  ondansetron (ZOFRAN ODT) 4 MG disintegrating tablet Take 1 tablet (4 mg total) by mouth every 8 (eight) hours as needed for nausea or vomiting. 03/21/15   Lutricia FeilWilliam P Roemer, PA-C    Allergies Sulfa antibiotics  Family History  Problem Relation Age of Onset  . Cancer Mother   . Diabetes Mother   . Hypertension Father   . Heart disease Father   . Diabetes Sister   . Diabetes Brother   . Lupus Sister     Social History Social History  Substance Use Topics  . Smoking status: Never Smoker  . Smokeless tobacco: Never Used  . Alcohol use 0.0 oz/week     Comment: rare consumption    Review of Systems Constitutional: No fever/chills Eyes: No visual changes. ENT: No sore throat. No stiff neck no neck pain Cardiovascular: Denies chest pain. Respiratory: Denies shortness of breath. Gastrointestinal:   no vomiting.  No diarrhea.  No constipation. Genitourinary: Negative for dysuria. Musculoskeletal: Negative lower extremity swelling Skin: Negative for rash. Neurological: Negative for severe headaches, focal weakness or numbness. 10-point ROS otherwise negative.  ____________________________________________   PHYSICAL EXAM:  VITAL SIGNS: ED Triage Vitals [07/03/16 1147]  Enc Vitals Group     BP 126/84     Pulse Rate 91     Resp 17     Temp 99.2 F (37.3 C)     Temp Source Oral     SpO2 97 %      Weight 238 lb (108 kg)     Height 5' (1.524 m)     Head Circumference      Peak Flow      Pain Score 9     Pain Loc      Pain Edu?      Excl. in GC?     Constitutional: Alert and oriented. Well appearing and in no acute distress. Eyes: Conjunctivae are normal. PERRL. EOMI. Head: Atraumatic. Nose: No congestion/rhinnorhea. Mouth/Throat: Mucous membranes are moist.  Oropharynx non-erythematous. Neck: No stridor.   Nontender with no meningismus Cardiovascular: Normal rate, regular rhythm. Grossly normal heart sounds.  Good peripheral circulation. Respiratory: Normal respiratory effort.  No retractions. Lungs CTAB. Abdominal: Soft and nontender. No distention. No guarding no rebound Back:  There is no focal tenderness or step off.  there is no midline tenderness there are no lesions noted. there is no CVA tenderness Rectal: Female nurse chaperone present there is a large external hemorrhoid at around 6:00, it is not thrombosed it is tender is mildly inflamed, there is no evidence of abscess or surrounding cellulitic changes. Musculoskeletal: No lower extremity tenderness, no upper extremity tenderness. No joint effusions, no DVT signs strong distal pulses no edema Neurologic:  Normal speech and language. No gross focal neurologic deficits are appreciated.  Skin:  Skin is warm, dry and intact. No rash noted. Psychiatric: Mood and affect are normal. Speech and behavior are normal.  ____________________________________________   LABS (all labs ordered are listed, but only abnormal results are displayed)  Labs Reviewed - No data to display ____________________________________________  EKG  I personally interpreted any EKGs ordered by me or triage = ____________________________________________  RADIOLOGY  I reviewed any imaging ordered by me or triage that were performed during my shift and, if possible, patient and/or family made aware of any abnormal  findings. ____________________________________________   PROCEDURES  Procedure(s) performed: None  Procedures  Critical Care performed: None  ____________________________________________   INITIAL IMPRESSION / ASSESSMENT AND PLAN / ED COURSE  Pertinent labs & imaging results that were available during my care of the patient were reviewed by me and considered in my medical decision making (see chart for details).  At this time, patient does appear to have an external hemorrhoid there is no evidence of rectal abscess or perirectal abscess, I don't think and fortunately that this will be amenable to incision and drainage in the emergency department, it is not thrombosed. We will give her topical lidocaine to see if he continues to discomfort. I will refer her closely outpatient surgical follow-up. We will give her a prescription for Preparation H. She is requesting a work note which we will also give her. Extensive return precautions given for fever, increased pain or any other signs that perhaps an abscess developing but at this time that does not appear to be consistent with the clinical picture.  Procedure: Female chaperone present, I did apply viscous lidocaine 2%  to a pledget and apply to her hemorrhoid. Patient has had great relief of discomfort and is eager to go home    ____________________________________________   FINAL CLINICAL IMPRESSION(S) / ED DIAGNOSES  Final diagnoses:  None      This chart was dictated using voice recognition software.  Despite best efforts to proofread,  errors can occur which can change meaning.      Jeanmarie Plant, MD 07/03/16 1231    Jeanmarie Plant, MD 07/03/16 1250

## 2016-07-04 ENCOUNTER — Encounter: Payer: Self-pay | Admitting: *Deleted

## 2016-07-10 ENCOUNTER — Telehealth: Payer: Self-pay

## 2016-07-10 NOTE — Telephone Encounter (Signed)
I called Tamara Flowers to confirm her appointment for Thursday. She asked me if the surgeon would remove her hemorrhoids at that visit. I explained to the patient that the visit is only a consultation and that surgery would be scheduled on that visit if it needed to be. She asked me how long and I told her that I do not know his surgery schedule but maybe 2-3 weeks give or take. She does not want to wait, she wants to have it done in the office. She told me to cancel her appointment because she wants to see Dr. Lemar LivingsByrnett and heard that he would do it that same day. I told the patient that it's okay and I will remove her from the schedule.

## 2016-07-12 ENCOUNTER — Encounter: Payer: Self-pay | Admitting: General Surgery

## 2016-07-12 ENCOUNTER — Ambulatory Visit (INDEPENDENT_AMBULATORY_CARE_PROVIDER_SITE_OTHER): Payer: 59 | Admitting: General Surgery

## 2016-07-12 ENCOUNTER — Inpatient Hospital Stay: Payer: 59 | Admitting: Surgery

## 2016-07-12 VITALS — BP 126/86 | HR 116 | Resp 16 | Ht 60.0 in | Wt 237.0 lb

## 2016-07-12 DIAGNOSIS — K649 Unspecified hemorrhoids: Secondary | ICD-10-CM | POA: Diagnosis not present

## 2016-07-12 MED ORDER — PREDNISONE 10 MG (21) PO TBPK: ORAL_TABLET | ORAL | 0 refills | Status: DC

## 2016-07-12 NOTE — Patient Instructions (Addendum)
Hemorrhoids Hemorrhoids are swollen veins in and around the rectum or anus. Hemorrhoids can cause pain, itching, or bleeding. Most of the time, they do not cause serious problems. They usually get better with diet changes, lifestyle changes, and other home treatments. Follow these instructions at home: Eating and drinking   Eat foods that have fiber, such as whole grains, beans, nuts, fruits, and vegetables. Ask your doctor about taking products that have added fiber (fibersupplements).  Drink enough fluid to keep your pee (urine) clear or pale yellow. For Pain and Swelling   Take a warm-water bath (sitz bath) for 20 minutes to ease pain. Do this 3-4 times a day.  If directed, put ice on the painful area. It may be helpful to use ice between your warm baths.  Put ice in a plastic bag.  Place a towel between your skin and the bag.  Leave the ice on for 20 minutes, 2-3 times a day. General instructions   Take over-the-counter and prescription medicines only as told by your doctor.  Medicated creams and medicines that are inserted into the anus (suppositories) may be used or applied as told.  Exercise often.  Go to the bathroom when you have the urge to poop (to have a bowel movement). Do not wait.  Avoid pushing too hard (straining) when you poop.  Keep the butt area dry and clean. Use wet toilet paper or moist paper towels.  Do not sit on the toilet for a long time. Contact a doctor if:  You have any of these:  Pain and swelling that do not get better with treatment or medicine.  Bleeding that will not stop.  Trouble pooping or you cannot poop.  Pain or swelling outside the area of the hemorrhoids. This information is not intended to replace advice given to you by your health care provider. Make sure you discuss any questions you have with your health care provider. Document Released: 01/17/2008 Document Revised: 09/15/2015 Document Reviewed: 12/22/2014 Elsevier  Interactive Patient Education  2017 [employer]      Patient to call us on Tuesday and report.

## 2016-07-12 NOTE — Progress Notes (Signed)
Patient ID: Tamara Flowers, female   DOB: [birth year], 46 y.o.   MRN: [id number]  Chief Complaint  Patient presents with  . Rectal Problems    HPI Tamara Flowers is a 46 y.o. female here today for an evaluation of hemorrhoids. She has had hemorrhoid problems for 18 years now.Patient states she was seen in the ED on 07/03/2016. No bleeding, but a lot of sharp throbbing pains. The last time she had hemorrhoid  issue was two years ago. Moves her bowels every two days. Patient states she has attempted creams and sitz baths.        She was unable to obtain the lidocaine cream from her pharmacy. She has been using suppositories and external Anusol cream.                                                     .HPI  Past Medical History:  Diagnosis Date  . Diabetes mellitus without complication (HCC)   . Edema 01/05/2015  . Fatigue 01/05/2015  . GERD (gastroesophageal reflux disease) 01/05/2015  . Hyperlipidemia 01/05/2015  . Hypertension 01/05/2015  . Obesity 01/05/2015  . Thyroid disease   . Vitamin D deficiency 01/05/2015    Past Surgical History:  Procedure Laterality Date  . CHOLECYSTECTOMY    . NASAL SEPTUM SURGERY    . NASAL SINUS SURGERY    . TONSILLECTOMY    . VAGINAL HYSTERECTOMY      Family History  Problem Relation Age of Onset  . Cancer Mother   . Diabetes Mother   . Hypertension Father   . Heart disease Father   . Diabetes Sister   . Diabetes Brother   . Lupus Sister     Social History Social History  Substance Use Topics  . Smoking status: Never Smoker  . Smokeless tobacco: Never Used  . Alcohol use 0.0 oz/week     Comment: rare consumption    Allergies  Allergen Reactions  . Sulfa Antibiotics Nausea Only    Other reaction(s): Vomiting    Current Outpatient Prescriptions  Medication Sig Dispense Refill  . acetaminophen (TYLENOL) 500 MG tablet Take by mouth.    . Cholecalciferol (VITAMIN D3) 1000 UNITS CAPS Take by mouth.    . fexofenadine (WAL-FEX ALLERGY)  180 MG tablet Take by mouth.    . hydrochlorothiazide (HYDRODIURIL) 50 MG tablet Take by mouth.    . hydrocortisone (ANUSOL-HC) 2.5 % rectal cream Apply rectally 2 times daily 30 g 1  . Lidocaine, Anorectal, 5 % CREA Apply to rectum tid x 5 days qs zero refill 1 Tube 0  . shark liver oil-cocoa butter (PREPARATION H) 0.25-88.44 % suppository Place 1 suppository rectally as needed for hemorrhoids. 12 suppository 0  . predniSONE (STERAPRED UNI-PAK 21 TAB) 10 MG (21) TBPK tablet Six tablets to start, decrease by one tablet daily. 21 tablet 0   No current facility-administered medications for this visit.     Review of Systems Review of Systems  Constitutional: Negative.   Respiratory: Negative.   Cardiovascular: Negative.   Gastrointestinal: Positive for rectal pain.    Blood pressure 126/86, pulse (!) 116, resp. rate 16, height 5' (1.524 m), weight 237 lb (107.5 kg).  Physical Exam Physical Exam  Constitutional: She is oriented to person, place, and time. She appears well-developed and well-nourished.  Genitourinary:  Genitourinary Comments: Digital exam after 5 mL of Xylocaine jelly showed no palpable mass in the lower rectum. No palpable fissure. Anoscopy showed no internal hemorrhoids or fissure.  Neurological: She is alert and oriented to person, place, and time.  Skin: Skin is warm and dry.    Data Reviewed Emergency Department record of 07/03/2016.  Assessment    Resolving anorectal inflammation without evidence of acute thrombosis    Plan    The patient shows a fair minus sensitivity to the redundant skin around the anus, but nothing warranting surgical intervention. I recommended a rapid trial of steroids to see if this will not resolve her residual discomfort. She is made use of a prednisone taper pack in the past. She'll continue her topical measures.    Patient to call us on Tuesday and report.  This information has been scribed by Ples SpecterJessica Titan Karner  CMA.   Earline MayotteByrnett, Jeffrey W 07/13/2016, 6:55 AM

## 2016-07-13 DIAGNOSIS — K649 Unspecified hemorrhoids: Secondary | ICD-10-CM | POA: Insufficient documentation

## 2016-10-31 ENCOUNTER — Ambulatory Visit
Admission: RE | Admit: 2016-10-31 | Discharge: 2016-10-31 | Disposition: A | Discharge status: Home / Self Care | Payer: BLUE CROSS/BLUE SHIELD | Source: Ambulatory Visit | Attending: Physician Assistant | Admitting: Physician Assistant

## 2016-10-31 ENCOUNTER — Other Ambulatory Visit: Payer: Self-pay | Admitting: Physician Assistant

## 2016-10-31 DIAGNOSIS — Z1231 Encounter for screening mammogram for malignant neoplasm of breast: Secondary | ICD-10-CM | POA: Insufficient documentation

## 2016-12-05 DIAGNOSIS — M7061 Trochanteric bursitis, right hip: Secondary | ICD-10-CM | POA: Insufficient documentation

## 2016-12-05 DIAGNOSIS — M5136 Other intervertebral disc degeneration, lumbar region: Secondary | ICD-10-CM | POA: Insufficient documentation

## 2016-12-05 DIAGNOSIS — M25561 Pain in right knee: Secondary | ICD-10-CM | POA: Insufficient documentation

## 2018-01-01 ENCOUNTER — Other Ambulatory Visit: Payer: Self-pay | Admitting: Physician Assistant

## 2018-01-02 ENCOUNTER — Other Ambulatory Visit: Payer: Self-pay | Admitting: Physician Assistant

## 2018-01-02 DIAGNOSIS — Z1231 Encounter for screening mammogram for malignant neoplasm of breast: Secondary | ICD-10-CM

## 2018-01-02 DIAGNOSIS — E8941 Symptomatic postprocedural ovarian failure: Secondary | ICD-10-CM

## 2018-01-02 DIAGNOSIS — E8940 Asymptomatic postprocedural ovarian failure: Secondary | ICD-10-CM | POA: Insufficient documentation

## 2018-01-29 ENCOUNTER — Encounter (INDEPENDENT_AMBULATORY_CARE_PROVIDER_SITE_OTHER): Payer: Self-pay

## 2018-01-29 ENCOUNTER — Ambulatory Visit
Admission: RE | Admit: 2018-01-29 | Discharge: 2018-01-29 | Disposition: A | Discharge status: Home / Self Care | Payer: BLUE CROSS/BLUE SHIELD | Source: Ambulatory Visit | Attending: Physician Assistant | Admitting: Physician Assistant

## 2018-01-29 DIAGNOSIS — E8941 Symptomatic postprocedural ovarian failure: Secondary | ICD-10-CM | POA: Diagnosis not present

## 2018-01-29 DIAGNOSIS — Z1231 Encounter for screening mammogram for malignant neoplasm of breast: Secondary | ICD-10-CM

## 2018-10-14 ENCOUNTER — Other Ambulatory Visit: Payer: Self-pay

## 2018-10-14 ENCOUNTER — Ambulatory Visit
Admission: RE | Admit: 2018-10-14 | Discharge: 2018-10-14 | Disposition: A | Discharge status: Home / Self Care | Payer: BC Managed Care – PPO | Source: Ambulatory Visit | Attending: Physician Assistant | Admitting: Physician Assistant

## 2018-10-14 ENCOUNTER — Ambulatory Visit
Admission: RE | Admit: 2018-10-14 | Discharge: 2018-10-14 | Disposition: A | Discharge status: Home / Self Care | Payer: BC Managed Care – PPO | Attending: Physician Assistant | Admitting: Physician Assistant

## 2018-10-14 ENCOUNTER — Other Ambulatory Visit: Payer: Self-pay | Admitting: Physician Assistant

## 2018-10-14 DIAGNOSIS — M25562 Pain in left knee: Secondary | ICD-10-CM | POA: Insufficient documentation

## 2018-11-11 ENCOUNTER — Other Ambulatory Visit: Payer: Self-pay

## 2018-11-11 DIAGNOSIS — Z20822 Contact with and (suspected) exposure to covid-19: Secondary | ICD-10-CM

## 2018-11-13 LAB — NOVEL CORONAVIRUS, NAA: SARS-CoV-2, NAA: NOT DETECTED

## 2018-11-22 DIAGNOSIS — H8110 Benign paroxysmal vertigo, unspecified ear: Secondary | ICD-10-CM

## 2018-11-22 HISTORY — DX: Benign paroxysmal vertigo, unspecified ear: H81.10

## 2018-12-02 ENCOUNTER — Encounter: Payer: Self-pay | Admitting: Emergency Medicine

## 2018-12-02 ENCOUNTER — Ambulatory Visit
Admission: EM | Admit: 2018-12-02 | Discharge: 2018-12-02 | Disposition: A | Discharge status: Home / Self Care | Payer: BC Managed Care – PPO | Attending: Family Medicine | Admitting: Family Medicine

## 2018-12-02 ENCOUNTER — Other Ambulatory Visit: Payer: Self-pay

## 2018-12-02 DIAGNOSIS — H8110 Benign paroxysmal vertigo, unspecified ear: Secondary | ICD-10-CM

## 2018-12-02 MED ORDER — MECLIZINE HCL 25 MG PO TABS: 25.0000 mg | ORAL_TABLET | Freq: Three times a day (TID) | ORAL | 0 refills | Status: DC | PRN

## 2018-12-02 NOTE — ED Provider Notes (Signed)
[facility]    CSN: [id number] Arrival date & time: 12/02/18  1046  History   Chief Complaint Chief Complaint  Patient presents with  . Dizziness   HPI   48 year old female presents with dizziness.  Patient reports her symptoms started abruptly this morning.  She states that she woke up and felt dizzy.  She describes it as a spinning sensation.  Associated nausea.  No vomiting.  No known inciting factor.  Worse with movement.  No relieving factors.  No medications or interventions tried.  No other associated symptoms.  No other complaints.  PMH, Surgical Hx, Family Hx, Social History reviewed and updated as below.  Past Medical History:  Diagnosis Date  . Diabetes mellitus without complication (HCC)   . Edema 01/05/2015  . Fatigue 01/05/2015  . GERD (gastroesophageal reflux disease) 01/05/2015  . Hyperlipidemia 01/05/2015  . Hypertension 01/05/2015  . Obesity 01/05/2015  . Thyroid disease   . Vitamin D deficiency 01/05/2015    Patient Active Problem List   Diagnosis Date Noted  . Hemorrhoids 07/13/2016  . Hyperglycemia 01/05/2015  . Hyperlipidemia 01/05/2015  . Hypertension 01/05/2015  . Fatigue 01/05/2015  . Vitamin D deficiency 01/05/2015  . GERD (gastroesophageal reflux disease) 01/05/2015  . Obesity 01/05/2015  . Edema 01/05/2015    Past Surgical History:  Procedure Laterality Date  . CHOLECYSTECTOMY    . NASAL SEPTUM SURGERY    . NASAL SINUS SURGERY    . TONSILLECTOMY    . VAGINAL HYSTERECTOMY      OB History   No obstetric history on file.      Home Medications    Prior to Admission medications   Medication Sig Start Date End Date Taking? Authorizing Provider  acetaminophen (TYLENOL) 500 MG tablet Take by mouth.   Yes [provider]  Cholecalciferol (VITAMIN D3) 1000 UNITS CAPS Take by mouth. 05/11/14  Yes [provider]  fexofenadine (WAL-FEX ALLERGY) 180 MG tablet Take by mouth. 01/28/14  Yes [provider]   hydrochlorothiazide (HYDRODIURIL) 50 MG tablet Take by mouth. 03/25/14  Yes [provider]  meclizine (ANTIVERT) 25 MG tablet Take 1 tablet (25 mg total) by mouth 3 (three) times daily as needed for dizziness. 12/02/18   Tommie Samsook, Darianne Muralles G, DO    Family History Family History  Problem Relation Age of Onset  . Cancer Mother   . Diabetes Mother   . Hypertension Father   . Heart disease Father   . Diabetes Sister   . Diabetes Brother   . Lupus Sister   . Breast cancer Neg Hx     Social History Social History   Tobacco Use  . Smoking status: Never Smoker  . Smokeless tobacco: Never Used  Substance Use Topics  . Alcohol use: Yes    Alcohol/week: 0.0 standard drinks    Comment: rare consumption  . Drug use: No     Allergies   Sulfa antibiotics   Review of Systems Review of Systems  Constitutional: Negative for fever.  Gastrointestinal: Positive for nausea.  Neurological: Positive for dizziness.   Physical Exam Triage Vital Signs ED Triage Vitals  Enc Vitals Group     BP 12/02/18 1207 103/75     Pulse Rate 12/02/18 1207 81     Resp 12/02/18 1207 18     Temp 12/02/18 1207 97.9 F (36.6 C)     Temp Source 12/02/18 1207 Oral     SpO2 12/02/18 1207 100 %     Weight  12/02/18 1205 195 lb (88.5 kg)     Height 12/02/18 1205 5' (1.524 m)     Head Circumference --      Peak Flow --      Pain Score 12/02/18 1205 0     Pain Loc --      Pain Edu? --      Excl. in [place]? --    Updated Vital Signs BP 103/75 (BP Location: Right Arm)   Pulse 81   Temp 97.9 F (36.6 C) (Oral)   Resp 18   Ht 5' (1.524 m)   Wt 88.5 kg   SpO2 100%   BMI 38.08 kg/m   Visual Acuity Right Eye Distance:   Left Eye Distance:   Bilateral Distance:    Right Eye Near:   Left Eye Near:    Bilateral Near:     Physical Exam Vitals signs and nursing note reviewed.  Constitutional:      General: She is not in acute distress.    Appearance: Normal appearance. She is obese.  HENT:      Head: Normocephalic and atraumatic.  Eyes:     Extraocular Movements: Extraocular movements intact.     Pupils: Pupils are equal, round, and reactive to light.  Cardiovascular:     Rate and Rhythm: Normal rate and regular rhythm.  Pulmonary:     Effort: Pulmonary effort is normal.     Breath sounds: Normal breath sounds. No wheezing, rhonchi or rales.  Neurological:     Mental Status: She is alert.  Psychiatric:        Mood and Affect: Mood normal.        Behavior: Behavior normal.    UC Treatments / Results  Labs (all labs ordered are listed, but only abnormal results are displayed) Labs Reviewed - No data to display  EKG   Radiology No results found.  Procedures Procedures (including critical care time)  Medications Ordered in UC Medications - No data to display  Initial Impression / Assessment and Plan / UC Course  I have reviewed the triage vital signs and the nursing notes.  Pertinent labs & imaging results that were available during my care of the patient were reviewed by me and considered in my medical decision making (see chart for details).    48 year old female presents with BPPV.  Treating with meclizine.  Information given regarding Epley maneuver.  Supportive care.  Final Clinical Impressions(s) / UC Diagnoses   Final diagnoses:  Benign paroxysmal positional vertigo, unspecified laterality   Discharge Instructions   None    ED Prescriptions    Medication Sig Dispense Auth. Provider   meclizine (ANTIVERT) 25 MG tablet Take 1 tablet (25 mg total) by mouth 3 (three) times daily as needed for dizziness. 30 tablet Coral Spikes, DO     Controlled Substance Prescriptions [place] Controlled Substance Registry consulted? Not Applicable   Coral Spikes, DO 12/02/18 1426

## 2018-12-02 NOTE — ED Triage Notes (Signed)
Patient states she woke up this morning feeling dizzy.

## 2018-12-16 ENCOUNTER — Other Ambulatory Visit: Payer: Self-pay | Admitting: Orthopedic Surgery

## 2018-12-16 DIAGNOSIS — M25462 Effusion, left knee: Secondary | ICD-10-CM

## 2018-12-16 DIAGNOSIS — M25562 Pain in left knee: Secondary | ICD-10-CM

## 2018-12-26 ENCOUNTER — Other Ambulatory Visit: Payer: Self-pay

## 2018-12-26 ENCOUNTER — Ambulatory Visit
Admission: RE | Admit: 2018-12-26 | Discharge: 2018-12-26 | Disposition: A | Discharge status: Home / Self Care | Payer: BC Managed Care – PPO | Source: Ambulatory Visit | Attending: Orthopedic Surgery | Admitting: Orthopedic Surgery

## 2018-12-26 DIAGNOSIS — M25462 Effusion, left knee: Secondary | ICD-10-CM | POA: Diagnosis present

## 2018-12-26 DIAGNOSIS — M25562 Pain in left knee: Secondary | ICD-10-CM | POA: Diagnosis present

## 2019-02-05 ENCOUNTER — Other Ambulatory Visit: Payer: Self-pay

## 2019-02-05 DIAGNOSIS — Z20822 Contact with and (suspected) exposure to covid-19: Secondary | ICD-10-CM

## 2019-02-05 DIAGNOSIS — U071 COVID-19: Secondary | ICD-10-CM

## 2019-02-05 HISTORY — DX: COVID-19: U07.1

## 2019-02-07 LAB — NOVEL CORONAVIRUS, NAA: SARS-CoV-2, NAA: DETECTED — AB

## 2019-03-13 ENCOUNTER — Other Ambulatory Visit: Payer: Self-pay | Admitting: Physician Assistant

## 2019-03-13 DIAGNOSIS — Z1231 Encounter for screening mammogram for malignant neoplasm of breast: Secondary | ICD-10-CM

## 2019-04-13 ENCOUNTER — Encounter: Payer: Self-pay | Admitting: Physician Assistant

## 2019-04-20 ENCOUNTER — Other Ambulatory Visit: Payer: Self-pay

## 2019-04-20 ENCOUNTER — Telehealth: Payer: Self-pay

## 2019-04-20 DIAGNOSIS — Z860100 Personal history of colon polyps, unspecified: Secondary | ICD-10-CM

## 2019-04-20 MED ORDER — NA SULFATE-K SULFATE-MG SULF 17.5-3.13-1.6 GM/177ML PO SOLN: 354.0000 mL | Freq: Once | ORAL | 0 refills | Status: AC

## 2019-04-20 NOTE — Telephone Encounter (Signed)
Gastroenterology Pre-Procedure Review    PATIENT REVIEW QUESTIONS: The patient responded to the following health history questions as indicated:    1. Are you having any GI issues? No  2. Do you have a personal history of Polyps? Yes had them removed 4 years  3. Do you have a family history of Colon Cancer or Polyps? Mother Colon  Cancer  4. Diabetes Mellitus? no 5. Joint replacements in the past 12 months?no 6. Major health problems in the past 3 months?no 7. Any artificial heart valves, MVP, or defibrillator?no    MEDICATIONS & ALLERGIES:    Patient reports the following regarding taking any anticoagulation/antiplatelet therapy:   Plavix, Coumadin, Eliquis, Xarelto, Lovenox, Pradaxa, Brilinta, or Effient? no Aspirin? no  Patient confirms/reports the following medications:  Current Outpatient Medications  Medication Sig Dispense Refill  . acetaminophen (TYLENOL) 500 MG tablet Take by mouth.    . Cholecalciferol (VITAMIN D3) 1000 UNITS CAPS Take by mouth.    . fexofenadine (WAL-FEX ALLERGY) 180 MG tablet Take by mouth.    . hydrochlorothiazide (HYDRODIURIL) 50 MG tablet Take by mouth.    . meclizine (ANTIVERT) 25 MG tablet Take 1 tablet (25 mg total) by mouth 3 (three) times daily as needed for dizziness. 30 tablet 0   No current facility-administered medications for this visit.    Patient confirms/reports the following allergies:  Allergies  Allergen Reactions  . Sulfa Antibiotics Nausea Only    Other reaction(s): Vomiting    No orders of the defined types were placed in this encounter.   AUTHORIZATION INFORMATION Primary Insurance: 1D#: Group #:  Secondary Insurance: 1D#: Group #:  SCHEDULE INFORMATION: Date: 05/14/2019 Time: Location:[place]

## 2019-05-06 ENCOUNTER — Encounter: Payer: Self-pay | Admitting: Gastroenterology

## 2019-05-06 ENCOUNTER — Other Ambulatory Visit: Payer: Self-pay

## 2019-05-11 ENCOUNTER — Other Ambulatory Visit
Admission: RE | Admit: 2019-05-11 | Discharge: 2019-05-11 | Disposition: A | Discharge status: Home / Self Care | Payer: BC Managed Care – PPO | Source: Ambulatory Visit | Attending: Gastroenterology | Admitting: Gastroenterology

## 2019-05-11 ENCOUNTER — Other Ambulatory Visit: Payer: BC Managed Care – PPO

## 2019-05-11 DIAGNOSIS — Z20822 Contact with and (suspected) exposure to covid-19: Secondary | ICD-10-CM | POA: Insufficient documentation

## 2019-05-11 DIAGNOSIS — Z01812 Encounter for preprocedural laboratory examination: Secondary | ICD-10-CM | POA: Diagnosis not present

## 2019-05-11 LAB — SARS CORONAVIRUS 2 (TAT 6-24 HRS): SARS Coronavirus 2: NEGATIVE

## 2019-05-12 ENCOUNTER — Other Ambulatory Visit: Payer: BC Managed Care – PPO

## 2019-05-13 NOTE — Discharge Instructions (Signed)
General Anesthesia, Adult, Care After This sheet gives you information about how to care for yourself after your procedure. Your health care provider may also give you more specific instructions. If you have problems or questions, contact your health care provider. What can I expect after the procedure? After the procedure, the following side effects are common:  Pain or discomfort at the IV site.  Nausea.  Vomiting.  Sore throat.  Trouble concentrating.  Feeling cold or chills.  Weak or tired.  Sleepiness and fatigue.  Soreness and body aches. These side effects can affect parts of the body that were not involved in surgery. Follow these instructions at home:  For at least 24 hours after the procedure:  Have a responsible adult stay with you. It is important to have someone help care for you until you are awake and alert.  Rest as needed.  Do not: ? Participate in activities in which you could fall or become injured. ? Drive. ? Use heavy machinery. ? Drink alcohol. ? Take sleeping pills or medicines that cause drowsiness. ? Make important decisions or sign legal documents. ? Take care of children on your own. Eating and drinking  Follow any instructions from your health care provider about eating or drinking restrictions.  When you feel hungry, start by eating small amounts of foods that are soft and easy to digest (bland), such as toast. Gradually return to your regular diet.  Drink enough fluid to keep your urine pale yellow.  If you vomit, rehydrate by drinking water, juice, or clear broth. General instructions  If you have sleep apnea, surgery and certain medicines can increase your risk for breathing problems. Follow instructions from your health care provider about wearing your sleep device: ? Anytime you are sleeping, including during daytime naps. ? While taking prescription pain medicines, sleeping medicines, or medicines that make you drowsy.  Return to  your normal activities as told by your health care provider. Ask your health care provider what activities are safe for you.  Take over-the-counter and prescription medicines only as told by your health care provider.  If you smoke, do not smoke without supervision.  Keep all follow-up visits as told by your health care provider. This is important. Contact a health care provider if:  You have nausea or vomiting that does not get better with medicine.  You cannot eat or drink without vomiting.  You have pain that does not get better with medicine.  You are unable to pass urine.  You develop a skin rash.  You have a fever.  You have redness around your IV site that gets worse. Get help right away if:  You have difficulty breathing.  You have chest pain.  You have blood in your urine or stool, or you vomit blood. Summary  After the procedure, it is common to have a sore throat or nausea. It is also common to feel tired.  Have a responsible adult stay with you for the first 24 hours after general anesthesia. It is important to have someone help care for you until you are awake and alert.  When you feel hungry, start by eating small amounts of foods that are soft and easy to digest (bland), such as toast. Gradually return to your regular diet.  Drink enough fluid to keep your urine pale yellow.  Return to your normal activities as told by your health care provider. Ask your health care provider what activities are safe for you. This information is not   intended to replace advice given to you by your health care provider. Make sure you discuss any questions you have with your health care provider. Document Revised: 04/12/2017 Document Reviewed: 11/23/2016 Elsevier Patient Education  2020 Elsevier Inc[employer]  

## 2019-05-14 ENCOUNTER — Other Ambulatory Visit: Payer: Self-pay

## 2019-05-14 ENCOUNTER — Ambulatory Visit: Payer: BC Managed Care – PPO | Admitting: Anesthesiology

## 2019-05-14 ENCOUNTER — Ambulatory Visit
Admission: RE | Admit: 2019-05-14 | Discharge: 2019-05-14 | Disposition: A | Discharge status: Home / Self Care | Payer: BC Managed Care – PPO | Attending: Gastroenterology | Admitting: Gastroenterology

## 2019-05-14 ENCOUNTER — Encounter
Admission: RE | Disposition: A | Discharge status: Home / Self Care | Payer: Self-pay | Source: Home / Self Care | Attending: Gastroenterology

## 2019-05-14 ENCOUNTER — Encounter: Payer: Self-pay | Admitting: Gastroenterology

## 2019-05-14 DIAGNOSIS — Z86010 Personal history of colonic polyps: Secondary | ICD-10-CM | POA: Diagnosis not present

## 2019-05-14 DIAGNOSIS — Z882 Allergy status to sulfonamides status: Secondary | ICD-10-CM | POA: Insufficient documentation

## 2019-05-14 DIAGNOSIS — Z6841 Body Mass Index (BMI) 40.0 and over, adult: Secondary | ICD-10-CM | POA: Diagnosis not present

## 2019-05-14 DIAGNOSIS — K644 Residual hemorrhoidal skin tags: Secondary | ICD-10-CM | POA: Diagnosis not present

## 2019-05-14 DIAGNOSIS — Z79899 Other long term (current) drug therapy: Secondary | ICD-10-CM | POA: Insufficient documentation

## 2019-05-14 DIAGNOSIS — E119 Type 2 diabetes mellitus without complications: Secondary | ICD-10-CM | POA: Diagnosis not present

## 2019-05-14 DIAGNOSIS — Z8616 Personal history of COVID-19: Secondary | ICD-10-CM | POA: Insufficient documentation

## 2019-05-14 DIAGNOSIS — Z800 Family history of malignant neoplasm of digestive organs: Secondary | ICD-10-CM

## 2019-05-14 DIAGNOSIS — M19012 Primary osteoarthritis, left shoulder: Secondary | ICD-10-CM | POA: Insufficient documentation

## 2019-05-14 DIAGNOSIS — M19011 Primary osteoarthritis, right shoulder: Secondary | ICD-10-CM | POA: Insufficient documentation

## 2019-05-14 DIAGNOSIS — K648 Other hemorrhoids: Secondary | ICD-10-CM | POA: Diagnosis not present

## 2019-05-14 DIAGNOSIS — I10 Essential (primary) hypertension: Secondary | ICD-10-CM | POA: Insufficient documentation

## 2019-05-14 DIAGNOSIS — Z1211 Encounter for screening for malignant neoplasm of colon: Secondary | ICD-10-CM | POA: Diagnosis not present

## 2019-05-14 DIAGNOSIS — E785 Hyperlipidemia, unspecified: Secondary | ICD-10-CM | POA: Insufficient documentation

## 2019-05-14 DIAGNOSIS — M160 Bilateral primary osteoarthritis of hip: Secondary | ICD-10-CM | POA: Diagnosis not present

## 2019-05-14 DIAGNOSIS — K219 Gastro-esophageal reflux disease without esophagitis: Secondary | ICD-10-CM | POA: Diagnosis not present

## 2019-05-14 DIAGNOSIS — M170 Bilateral primary osteoarthritis of knee: Secondary | ICD-10-CM | POA: Insufficient documentation

## 2019-05-14 HISTORY — DX: Other complications of anesthesia, initial encounter: T88.59XA

## 2019-05-14 HISTORY — DX: Unspecified hemorrhoids: K64.9

## 2019-05-14 HISTORY — DX: Family history of other specified conditions: Z84.89

## 2019-05-14 HISTORY — PX: COLONOSCOPY WITH PROPOFOL: SHX5780

## 2019-05-14 HISTORY — DX: Unspecified osteoarthritis, unspecified site: M19.90

## 2019-05-14 HISTORY — DX: Migraine, unspecified, not intractable, without status migrainosus: G43.909

## 2019-05-14 SURGERY — COLONOSCOPY WITH PROPOFOL
Anesthesia: General

## 2019-05-14 MED ORDER — LACTATED RINGERS IV SOLN: INTRAVENOUS | Status: DC

## 2019-05-14 MED ORDER — ONDANSETRON HCL 4 MG/2ML IJ SOLN: 4.0000 mg | Freq: Once | INTRAMUSCULAR | Status: DC | PRN

## 2019-05-14 MED ORDER — STERILE WATER FOR IRRIGATION IR SOLN: Status: DC | PRN

## 2019-05-14 MED ORDER — LIDOCAINE HCL (CARDIAC) PF 100 MG/5ML IV SOSY
PREFILLED_SYRINGE | INTRAVENOUS | Status: DC | PRN
  Administered 2019-05-14: 30 mg via INTRAVENOUS

## 2019-05-14 MED ORDER — PROPOFOL 10 MG/ML IV BOLUS
INTRAVENOUS | Status: DC | PRN
  Administered 2019-05-14 (×2): 50 mg via INTRAVENOUS
  Administered 2019-05-14: 15 mg via INTRAVENOUS

## 2019-05-14 SURGICAL SUPPLY — 25 items
CANISTER SUCT 1200ML W/VALVE (MISCELLANEOUS) ×3 IMPLANT
CLIP HMST 235XBRD CATH ROT (MISCELLANEOUS) IMPLANT
CLIP RESOLUTION 360 11X235 (MISCELLANEOUS)
ELECT REM PT RETURN 9FT ADLT (ELECTROSURGICAL)
ELECTRODE REM PT RTRN 9FT ADLT (ELECTROSURGICAL) IMPLANT
FCP ESCP3.2XJMB 240X2.8X (MISCELLANEOUS)
FORCEPS BIOP RAD 4 LRG CAP 4 (CUTTING FORCEPS) IMPLANT
FORCEPS BIOP RJ4 240 W/NDL (MISCELLANEOUS)
FORCEPS ESCP3.2XJMB 240X2.8X (MISCELLANEOUS) IMPLANT
GOWN CVR UNV OPN BCK APRN NK (MISCELLANEOUS) ×2 IMPLANT
GOWN ISOL THUMB LOOP REG UNIV (MISCELLANEOUS) ×6
INJECTOR VARIJECT VIN23 (MISCELLANEOUS) IMPLANT
KIT DEFENDO VALVE AND CONN (KITS) IMPLANT
KIT ENDO PROCEDURE OLY (KITS) ×3 IMPLANT
MARKER SPOT ENDO TATTOO 5ML (MISCELLANEOUS) IMPLANT
PROBE APC STR FIRE (PROBE) IMPLANT
RETRIEVER NET ROTH 2.5X230 LF (MISCELLANEOUS) IMPLANT
SNARE COLD EXACTO (MISCELLANEOUS) IMPLANT
SNARE SHORT THROW 13M SML OVAL (MISCELLANEOUS) IMPLANT
SNARE SHORT THROW 30M LRG OVAL (MISCELLANEOUS) IMPLANT
SNARE SNG USE RND 15MM (INSTRUMENTS) IMPLANT
SPOT EX ENDOSCOPIC TATTOO (MISCELLANEOUS)
TRAP ETRAP POLY (MISCELLANEOUS) IMPLANT
VARIJECT INJECTOR VIN23 (MISCELLANEOUS)
WATER STERILE IRR 250ML POUR (IV SOLUTION) ×3 IMPLANT

## 2019-05-14 NOTE — Anesthesia Preprocedure Evaluation (Signed)
Anesthesia Evaluation  Patient identified by MRN, date of birth, ID band Patient awake    Reviewed: Allergy & Precautions, NPO status   Airway Mallampati: II  TM Distance: >3 FB     Dental   Pulmonary    breath sounds clear to auscultation       Cardiovascular hypertension,  Rhythm:Regular Rate:Normal  HLD   Neuro/Psych  Headaches, BPPV    GI/Hepatic GERD  ,  Endo/Other  diabetesMorbid obesityPrediabetic  Renal/GU      Musculoskeletal  (+) Arthritis ,   Abdominal   Peds  Hematology   Anesthesia Other Findings   Reproductive/Obstetrics                             Anesthesia Physical Anesthesia Plan  ASA: III  Anesthesia Plan: General   Post-op Pain Management:    Induction: Intravenous  PONV Risk Score and Plan: 3 and Treatment may vary due to age or medical condition, TIVA and Propofol infusion  Airway Management Planned: Natural Airway and Nasal Cannula  Additional Equipment:   Intra-op Plan:   Post-operative Plan:   Informed Consent: I have reviewed the patients History and Physical, chart, labs and discussed the procedure including the risks, benefits and alternatives for the proposed anesthesia with the patient or authorized representative who has indicated his/her understanding and acceptance.       Plan Discussed with: CRNA  Anesthesia Plan Comments:         Anesthesia Quick Evaluation

## 2019-05-14 NOTE — H&P (Addendum)
Tamara Repress, MD [address]  [address]  [place], [place] [address]  Main: [phone]  Fax: [phone] Pager: [phone]  Primary Care Physician:  Tamara Flowers, [place] Primary Gastroenterologist:  Dr. Arlyss Flowers  Pre-Procedure History & Physical: HPI:  Tamara Flowers is a 49 y.o. female is here for an colonoscopy.   Past Medical History:  Diagnosis Date  . Arthritis    knees, hips, shoulders  . BPPV (benign paroxysmal positional vertigo) 11/2018   1 episode only  . Complication of anesthesia    itching, and slow to wake  . COVID-19 02/05/2019  . Diabetes mellitus without complication (HCC)    Pre-Diabetic before weight loss  . Edema 01/05/2015  . Family history of adverse reaction to anesthesia    Mother and siblings - itching and slow to wake  . Fatigue 01/05/2015  . GERD (gastroesophageal reflux disease) 01/05/2015  . Hemorrhoids   . Hyperlipidemia 01/05/2015  . Hypertension 01/05/2015  . Migraine headache    none in approx 2 yrs  . Obesity 01/05/2015  . Thyroid disease   . Vitamin D deficiency 01/05/2015    Past Surgical History:  Procedure Laterality Date  . CHOLECYSTECTOMY    . NASAL SEPTUM SURGERY    . NASAL SINUS SURGERY    . TONSILLECTOMY    . VAGINAL HYSTERECTOMY      Prior to Admission medications   Medication Sig Start Date End Date Taking? Authorizing Provider  acetaminophen (TYLENOL) 500 MG tablet Take by mouth.   Yes [provider]  Cholecalciferol (VITAMIN D3) 1000 UNITS CAPS Take by mouth. 05/11/14  Yes [provider]  diclofenac (VOLTAREN) 75 MG EC tablet Take 75 mg by mouth 2 (two) times daily as needed.   Yes [provider]  fexofenadine (WAL-FEX ALLERGY) 180 MG tablet Take by mouth. 01/28/14  Yes [provider]  fluticasone (FLONASE) 50 MCG/ACT nasal spray Place into both nostrils daily as needed for allergies or rhinitis.   Yes [provider]  hydrochlorothiazide (HYDRODIURIL) 50  MG tablet Take by mouth as needed.  03/25/14  Yes [provider]  ofloxacin (FLOXIN) 0.3 % OTIC solution 5 drops daily as needed.   Yes [provider]  Olopatadine HCl (PATADAY OP) Apply to eye as needed.   Yes [provider]  Omega-3 Fatty Acids (FISH OIL) 1000 MG CAPS Take by mouth 2 (two) times daily.   Yes [provider]  pantoprazole (PROTONIX) 20 MG tablet Take 20 mg by mouth daily.   Yes [provider]  phentermine 37.5 MG capsule Take 37.5 mg by mouth every morning.    [provider]    Allergies as of 04/20/2019 - Review Complete 12/02/2018  Allergen Reaction Noted  . Sulfa antibiotics Nausea Only 01/05/2015    Family History  Problem Relation Age of Onset  . Cancer Mother   . Diabetes Mother   . Hypertension Father   . Heart disease Father   . Diabetes Sister   . Diabetes Brother   . Lupus Sister   . Breast cancer Neg Hx     Social History   Socioeconomic History  . Marital status: Divorced    Spouse name: Not on file  . Number of children: Not on file  . Years of education: Not on file  . Highest education level: Not on file  Occupational History  . Not on file  Tobacco Use  . Smoking status: Never Smoker  .  Smokeless tobacco: Never Used  Substance and Sexual Activity  . Alcohol use: Yes    Alcohol/week: 0.0 standard drinks    Comment: rare consumption - "Holidays"  . Drug use: No  . Sexual activity: Not on file  Other Topics Concern  . Not on file  Social History Narrative  . Not on file   Social Determinants of Health   Financial Resource Strain:   . Difficulty of Paying Living Expenses: Not on file  Food Insecurity:   . Worried About Charity fundraiser in the Last Year: Not on file  . Ran Out of Food in the Last Year: Not on file  Transportation Needs:   . Lack of Transportation (Medical): Not on file  . Lack of Transportation (Non-Medical): Not on file  Physical Activity:   . Days of  Exercise per Week: Not on file  . Minutes of Exercise per Session: Not on file  Stress:   . Feeling of Stress : Not on file  Social Connections:   . Frequency of Communication with Friends and Family: Not on file  . Frequency of Social Gatherings with Friends and Family: Not on file  . Attends Religious Services: Not on file  . Active Member of Clubs or Organizations: Not on file  . Attends Archivist Meetings: Not on file  . Marital Status: Not on file  Intimate Partner Violence:   . Fear of Current or Ex-Partner: Not on file  . Emotionally Abused: Not on file  . Physically Abused: Not on file  . Sexually Abused: Not on file    Review of Systems: See HPI, otherwise negative ROS  Physical Exam: BP 112/83   Pulse (!) 103   Temp 97.7 F (36.5 C) (Temporal)   Resp 18   Ht 5' (1.524 m)   Wt 99.8 kg   SpO2 97%   BMI 42.97 kg/m  General:   Alert,  pleasant and cooperative in NAD Head:  Normocephalic and atraumatic. Neck:  Supple; no masses or thyromegaly. Lungs:  Clear throughout to auscultation.    Heart:  Regular rate and rhythm. Abdomen:  Soft, nontender and nondistended. Normal bowel sounds, without guarding, and without rebound.   Neurologic:  Alert and  oriented x4;  grossly normal neurologically.  Impression/Plan: Tamara Flowers is here for an colonoscopy to be performed for colon cancer screening, high risk, family history of colon cancer in mother  Risks, benefits, limitations, and alternatives regarding  colonoscopy have been reviewed with the patient.  Questions have been answered.  All parties agreeable.   Sherri Sear, MD  05/14/2019, 8:09 AM

## 2019-05-14 NOTE — Anesthesia Procedure Notes (Signed)
Date/Time: 05/14/2019 8:46 AM Performed by: Maree Krabbe, CRNA Pre-anesthesia Checklist: Patient identified, Emergency Drugs available, Suction available, Timeout performed and Patient being monitored Patient Re-evaluated:Patient Re-evaluated prior to induction Oxygen Delivery Method: Nasal cannula Placement Confirmation: positive ETCO2

## 2019-05-14 NOTE — Op Note (Signed)
[facility] Patient Name: Tamara Flowers Procedure Date: 05/14/2019 8:36 AM MRN: [id number] Account #: [id number] Date of Birth: 14-Nov-1970 Admit Type: Outpatient Age: 49 Room: [facility] OR ROOM 01 Gender: Female Note Status: Finalized Procedure:             Colonoscopy Indications:           High risk colon cancer surveillance: Personal history                         of colonic polyps, Family history of colon cancer in                         multiple first-degree relatives Providers:             Lin Landsman MD, MD Referring MD:          Loman [place]. Dara Lords (Referring MD) Medicines:             Monitored Anesthesia Care Complications:         No immediate complications. Estimated blood loss: None. Procedure:             Pre-Anesthesia Assessment:                        - Prior to the procedure, a History and Physical was                         performed, and patient medications and allergies were                         reviewed. The patient is competent. The risks and                         benefits of the procedure and the sedation options and                         risks were discussed with the patient. All questions                         were answered and informed consent was obtained.                         Patient identification and proposed procedure were                         verified by the physician, the nurse, the                         anesthesiologist, the anesthetist and the technician                         in the pre-procedure area in the procedure room in the                         endoscopy suite. Mental Status Examination: alert and                         oriented. Airway Examination: normal oropharyngeal  airway and neck mobility. Respiratory Examination:                         clear to auscultation. CV Examination: normal.                         Prophylactic Antibiotics: The patient does not  require                         prophylactic antibiotics. Prior Anticoagulants: The                         patient has taken no previous anticoagulant or                         antiplatelet agents. ASA Grade Assessment: III - A                         patient with severe systemic disease. After reviewing                         the risks and benefits, the patient was deemed in                         satisfactory condition to undergo the procedure. The                         anesthesia plan was to use monitored anesthesia care                         (MAC). Immediately prior to administration of                         medications, the patient was re-assessed for adequacy                         to receive sedatives. The heart rate, respiratory                         rate, oxygen saturations, blood pressure, adequacy of                         pulmonary ventilation, and response to care were                         monitored throughout the procedure. The physical                         status of the patient was re-assessed after the                         procedure.                        After obtaining informed consent, the colonoscope was                         passed under direct vision. Throughout the procedure,  the patient's blood pressure, pulse, and oxygen                         saturations were monitored continuously. The                         Colonoscope was introduced through the anus and                         advanced to the the cecum, identified by appendiceal                         orifice and ileocecal valve. The colonoscopy was                         performed with moderate difficulty due to significant                         looping and the patient's body habitus. Successful                         completion of the procedure was aided by applying                         abdominal pressure. The patient tolerated the                          procedure well. The quality of the bowel preparation                         was evaluated using the BBPS [facility] Bowel Preparation                         Scale) with scores of: Right Colon = 2 (minor amount                         of residual staining, small fragments of stool and/or                         opaque liquid, but mucosa seen well), Transverse Colon                         = 3 (entire mucosa seen well with no residual                         staining, small fragments of stool or opaque liquid)                         and Left Colon = 3 (entire mucosa seen well with no                         residual staining, small fragments of stool or opaque                         liquid). The total BBPS score equals 8. Findings:      Skin tags were found on perianal exam.      Non-bleeding external  and internal hemorrhoids were found during       retroflexion. The hemorrhoids were large.      The entire examined colon appeared normal. Impression:            - Perianal skin tags found on perianal exam.                        - Non-bleeding external and internal hemorrhoids.                        - The entire examined colon is normal.                        - No specimens collected. Recommendation:        - Discharge patient to home (with escort).                        - Resume previous diet today.                        - Continue present medications.                        - Repeat colonoscopy in 5 years for surveillance.                        - Return to my office as previously scheduled. Procedure Code(s):     --- Professional ---                        Y8657, Colorectal cancer screening; colonoscopy on                         individual at high risk Diagnosis Code(s):     --- Professional ---                        Z86.010, Personal history of colonic polyps                        K64.8, Other hemorrhoids                        K64.4, Residual hemorrhoidal skin tags                         Z80.0, Family history of malignant neoplasm of                         digestive organs CPT copyright 2019 American Medical Association. All rights reserved. The codes documented in this report are preliminary and upon coder review may  be revised to meet current compliance requirements. Dr. Ulyess Mort Lin Landsman MD, MD 05/14/2019 9:09:45 AM This report has been signed electronically. Number of Addenda: 0 Note Initiated On: 05/14/2019 8:36 AM Scope Withdrawal Time: 0 hours 8 minutes 22 seconds  Total Procedure Duration: 0 hours 13 minutes 14 seconds  Estimated Blood Loss:  Estimated blood loss: none.      [facility]

## 2019-05-14 NOTE — Anesthesia Postprocedure Evaluation (Signed)
Anesthesia Post Note  Patient: Tamara Flowers  Procedure(s) Performed: COLONOSCOPY WITH PROPOFOL (N/A )     Patient location during evaluation: PACU Anesthesia Type: General Level of consciousness: awake Pain management: pain level controlled Vital Signs Assessment: post-procedure vital signs reviewed and stable Respiratory status: respiratory function stable Cardiovascular status: stable Postop Assessment: no signs of nausea or vomiting Anesthetic complications: no    Jola Babinski

## 2019-05-14 NOTE — Transfer of Care (Signed)
Immediate Anesthesia Transfer of Care Note  Patient: Tamara Flowers  Procedure(s) Performed: COLONOSCOPY WITH PROPOFOL (N/A )  Patient Location: PACU  Anesthesia Type: General  Level of Consciousness: awake, alert  and patient cooperative  Airway and Oxygen Therapy: Patient Spontanous Breathing and Patient connected to supplemental oxygen  Post-op Assessment: Post-op Vital signs reviewed, Patient's Cardiovascular Status Stable, Respiratory Function Stable, Patent Airway and No signs of Nausea or vomiting  Post-op Vital Signs: Reviewed and stable  Complications: No apparent anesthesia complications

## 2019-05-15 ENCOUNTER — Encounter: Payer: Self-pay | Admitting: *Deleted

## 2019-06-01 ENCOUNTER — Ambulatory Visit (INDEPENDENT_AMBULATORY_CARE_PROVIDER_SITE_OTHER): Payer: BC Managed Care – PPO | Admitting: Gastroenterology

## 2019-06-01 ENCOUNTER — Encounter: Payer: Self-pay | Admitting: Gastroenterology

## 2019-06-01 ENCOUNTER — Other Ambulatory Visit: Payer: Self-pay

## 2019-06-01 VITALS — BP 117/78 | HR 97 | Temp 97.4°F | Resp 17 | Ht 61.0 in | Wt 228.6 lb

## 2019-06-01 DIAGNOSIS — K219 Gastro-esophageal reflux disease without esophagitis: Secondary | ICD-10-CM | POA: Diagnosis not present

## 2019-06-01 DIAGNOSIS — K640 First degree hemorrhoids: Secondary | ICD-10-CM | POA: Diagnosis not present

## 2019-06-01 DIAGNOSIS — K5909 Other constipation: Secondary | ICD-10-CM

## 2019-06-01 NOTE — Progress Notes (Signed)
Tamara Darby, MD [address]  [place]  [place], [place] [address]  Main: [phone]  Fax: [phone]    Gastroenterology Consultation  Referring Provider:     Elijah Birk, [place] Primary Care Physician:  Elijah Birk, [place] Primary Gastroenterologist:  Dr. Cephas Flowers Reason for Consultation: Chronic constipation, GERD and symptomatic hemorrhoids        HPI:   Tamara Flowers is a 49 y.o. female referred by Dr. Dara Lords, Loman [place], [place]  for consultation & management of chronic constipation.  Since last 1 year, patient has been experiencing severe constipation as she has been in quarantine several times in COVID-19 pandemic, due to her job and exposure to COVID-19 that has resulted in significant weight gain.  Patient acknowledges consuming red meat regularly, she added some fiber supplements in her diet which does not seem to be sufficient.  She reports hard stools, every 2 to 3 days associated with significant straining, rectal bleeding, swelling.  She also reports epigastric burning pain, reflux for which she takes pantoprazole 40 mg as needed only.  Patient lost about 20 to 30 pounds in the past, regained it within last 1 year  She does not smoke or drink alcohol heavily  NSAIDs: None  Antiplts/Anticoagulants/Anti thrombotics: None She does carry family history of colon cancer in multiple first-degree relatives GI Procedures:   Colonoscopy 05/14/2019- Perianal skin tags found on perianal exam. - Non-bleeding external and internal hemorrhoids. - The entire examined colon is normal. - No specimens collected.  Past Medical History:  Diagnosis Date  . Arthritis    knees, hips, shoulders  . BPPV (benign paroxysmal positional vertigo) 11/2018   1 episode only  . Complication of anesthesia    itching, and slow to wake  . COVID-19 02/05/2019  . Diabetes mellitus without complication ([place])    Pre-Diabetic before weight loss  . Edema 01/05/2015  . Family  history of adverse reaction to anesthesia    Mother and siblings - itching and slow to wake  . Fatigue 01/05/2015  . GERD (gastroesophageal reflux disease) 01/05/2015  . Hemorrhoids   . Hyperlipidemia 01/05/2015  . Hypertension 01/05/2015  . Migraine headache    none in approx 2 yrs  . Obesity 01/05/2015  . Thyroid disease   . Vitamin D deficiency 01/05/2015    Past Surgical History:  Procedure Laterality Date  . CHOLECYSTECTOMY    . COLONOSCOPY WITH PROPOFOL N/A 05/14/2019   Procedure: COLONOSCOPY WITH PROPOFOL;  Surgeon: Lin Landsman, MD;  Location: [facility];  Service: Endoscopy;  Laterality: N/A;  . NASAL SEPTUM SURGERY    . NASAL SINUS SURGERY    . TONSILLECTOMY    . VAGINAL HYSTERECTOMY      Current Outpatient Medications:  .  acetaminophen (TYLENOL) 500 MG tablet, Take by mouth., Disp: , Rfl:  .  Cholecalciferol (VITAMIN D3) 1000 UNITS CAPS, Take by mouth., Disp: , Rfl:  .  diclofenac (VOLTAREN) 75 MG EC tablet, Take 75 mg by mouth 2 (two) times daily as needed., Disp: , Rfl:  .  fexofenadine (WAL-FEX ALLERGY) 180 MG tablet, Take by mouth., Disp: , Rfl:  .  fluticasone (FLONASE) 50 MCG/ACT nasal spray, Place into both nostrils daily as needed for allergies or rhinitis., Disp: , Rfl:  .  hydrochlorothiazide (HYDRODIURIL) 12.5 MG tablet, Take 12.5 mg by mouth daily., Disp: , Rfl:  .  ofloxacin (FLOXIN) 0.3 % OTIC solution, 5 drops daily as needed., Disp: , Rfl:  .  Olopatadine HCl (PATADAY OP), Apply to eye as needed., Disp: , Rfl:  .  Omega-3 Fatty Acids (FISH OIL) 1000 MG CAPS, Take by mouth 2 (two) times daily., Disp: , Rfl:  .  pantoprazole (PROTONIX) 40 MG tablet, TAKE 1 TABLET BY MOUTH TWICE DAILY FOR 2 WEEKS THEN AS NEEDED, Disp: , Rfl:    Family History  Problem Relation Age of Onset  . Cancer Mother   . Diabetes Mother   . Hypertension Father   . Heart disease Father   . Diabetes Sister   . Diabetes Brother   . Lupus Sister   . Breast cancer Neg  Hx      Social History   Tobacco Use  . Smoking status: Never Smoker  . Smokeless tobacco: Never Used  Substance Use Topics  . Alcohol use: Yes    Alcohol/week: 0.0 standard drinks    Comment: rare consumption - "Holidays"  . Drug use: No    Allergies as of 06/01/2019 - Review Complete 06/01/2019  Allergen Reaction Noted  . Sulfa antibiotics Nausea And Vomiting 01/05/2015  . Sulfasalazine Nausea Only 01/05/2015    Review of Systems:    All systems reviewed and negative except where noted in HPI.   Physical Exam:  BP 117/78 (BP Location: Left Arm, Patient Position: Sitting, Cuff Size: Large)   Pulse 97   Temp (!) 97.4 F (36.3 C)   Resp 17   Ht 5\' 1"  (1.549 m)   Wt 228 lb 9.6 oz (103.7 kg)   BMI 43.19 kg/m  No LMP recorded. Patient has had a hysterectomy.  General:   Alert,  Well-developed, well-nourished, pleasant and cooperative in NAD Head:  Normocephalic and atraumatic. Eyes:  Sclera clear, no icterus.   Conjunctiva pink. Ears:  Normal auditory acuity. Nose:  No deformity, discharge, or lesions. Mouth:  No deformity or lesions,oropharynx pink & moist. Neck:  Supple; no masses or thyromegaly. Lungs:  Respirations even and unlabored.  Clear throughout to auscultation.   No wheezes, crackles, or rhonchi. No acute distress. Heart:  Regular rate and rhythm; no murmurs, clicks, rubs, or gallops. Abdomen:  Normal bowel sounds. Soft, obese, non-tender and non-distended without masses, hepatosplenomegaly or hernias noted.  No guarding or rebound tenderness.   Rectal: Not performed Msk:  Symmetrical without gross deformities. Good, equal movement & strength bilaterally. Pulses:  Normal pulses noted. Extremities:  No clubbing or edema.  No cyanosis. Neurologic:  Alert and oriented x3;  grossly normal neurologically. Skin:  Intact without significant lesions or rashes. No jaundice. Psych:  Alert and cooperative. Normal mood and affect.  Imaging Studies: Reviewed   Assessment and Plan:   Tamara Flowers is a 49 y.o. female with morbid obesity, BMI 46 history of hypertension is seen in consultation for chronic constipation, chronic GERD and symptomatic grade 1 hemorrhoids  Chronic constipation Recommend high-fiber diet, fiber supplements Adequate intake of water Cut back on red meat intake  Grade 1 hemorrhoids Recent colonoscopy revealed large internal and external hemorrhoids Discussed with patient about hemorrhoid ligation including risks and benefits and she is agreeable Consent obtained Perform hemorrhoid ligation today  Chronic GERD Discussed about antireflux lifestyle, information provided  Follow up in 2 weeks   44, MD

## 2019-06-01 NOTE — Patient Instructions (Signed)
High-Fiber Diet Fiber, also called dietary fiber, is a type of carbohydrate that is found in fruits, vegetables, whole grains, and beans. A high-fiber diet can have many health benefits. Your health care provider may recommend a high-fiber diet to help:  Prevent constipation. Fiber can make your bowel movements more regular.  Lower your cholesterol.  Relieve the following conditions: ? Swelling of veins in the anus (hemorrhoids). ? Swelling and irritation (inflammation) of specific areas of the digestive tract (uncomplicated diverticulosis). ? A problem of the large intestine (colon) that sometimes causes pain and diarrhea (irritable bowel syndrome, IBS).  Prevent overeating as part of a weight-loss plan.  Prevent heart disease, type 2 diabetes, and certain cancers. What is my plan? The recommended daily fiber intake in grams (g) includes:  38 g for men age 50 or younger.  30 g for men over age 50.  25 g for women age 50 or younger.  21 g for women over age 50. You can get the recommended daily intake of dietary fiber by:  Eating a variety of fruits, vegetables, grains, and beans.  Taking a fiber supplement, if it is not possible to get enough fiber through your diet. What do I need to know about a high-fiber diet?  It is better to get fiber through food sources rather than from fiber supplements. There is not a lot of research about how effective supplements are.  Always check the fiber content on the nutrition facts label of any prepackaged food. Look for foods that contain 5 g of fiber or more per serving.  Talk with a diet and nutrition specialist (dietitian) if you have questions about specific foods that are recommended or not recommended for your medical condition, especially if those foods are not listed below.  Gradually increase how much fiber you consume. If you increase your intake of dietary fiber too quickly, you may have bloating, cramping, or gas.  Drink plenty  of water. Water helps you to digest fiber. What are tips for following this plan?  Eat a wide variety of high-fiber foods.  Make sure that half of the grains that you eat each day are whole grains.  Eat breads and cereals that are made with whole-grain flour instead of refined flour or white flour.  Eat brown rice, bulgur wheat, or millet instead of white rice.  Start the day with a breakfast that is high in fiber, such as a cereal that contains 5 g of fiber or more per serving.  Use beans in place of meat in soups, salads, and pasta dishes.  Eat high-fiber snacks, such as berries, raw vegetables, nuts, and popcorn.  Choose whole fruits and vegetables instead of processed forms like juice or sauce. What foods can I eat?  Fruits Berries. Pears. Apples. Oranges. Avocado. Prunes and raisins. Dried figs. Vegetables Sweet potatoes. Spinach. Kale. Artichokes. Cabbage. Broccoli. Cauliflower. Green peas. Carrots. Squash. Grains Whole-grain breads. Multigrain cereal. Oats and oatmeal. Brown rice. Barley. Bulgur wheat. Millet. Quinoa. Bran muffins. Popcorn. Rye wafer crackers. Meats and other proteins Navy, kidney, and pinto beans. Soybeans. Split peas. Lentils. Nuts and seeds. Dairy Fiber-fortified yogurt. Beverages Fiber-fortified soy milk. Fiber-fortified orange juice. Other foods Fiber bars. The items listed above may not be a complete list of recommended foods and beverages. Contact a dietitian for more options. What foods are not recommended? Fruits Fruit juice. Cooked, strained fruit. Vegetables Fried potatoes. Canned vegetables. Well-cooked vegetables. Grains White bread. Pasta made with refined flour. White rice. Meats and other   proteins Fatty cuts of meat. Fried chicken or fried fish. Dairy Milk. Yogurt. Cream cheese. Sour cream. Fats and oils Butters. Beverages Soft drinks. Other foods Cakes and pastries. The items listed above may not be a complete list of foods  and beverages to avoid. Contact a dietitian for more information. Summary  Fiber is a type of carbohydrate. It is found in fruits, vegetables, whole grains, and beans.  There are many health benefits of eating a high-fiber diet, such as preventing constipation, lowering blood cholesterol, helping with weight loss, and reducing your risk of heart disease, diabetes, and certain cancers.  Gradually increase your intake of fiber. Increasing too fast can result in cramping, bloating, and gas. Drink plenty of water while you increase your fiber.  The best sources of fiber include whole fruits and vegetables, whole grains, nuts, seeds, and beans. This information is not intended to replace advice given to you by your health care provider. Make sure you discuss any questions you have with your health care provider. Document Revised: 02/11/2017 Document Reviewed: 02/11/2017 Elsevier Patient Education  2020 Elsevier Inc. Food Choices for Gastroesophageal Reflux Disease, Adult When you have gastroesophageal reflux disease (GERD), the foods you eat and your eating habits are very important. Choosing the right foods can help ease your discomfort. Think about working with a nutrition specialist (dietitian) to help you make good choices. What are tips for following this plan?  Meals  Choose healthy foods that are low in fat, such as fruits, vegetables, whole grains, low-fat dairy products, and lean meat, fish, and poultry.  Eat small meals often instead of 3 large meals a day. Eat your meals slowly, and in a place where you are relaxed. Avoid bending over or lying down until 2-3 hours after eating.  Avoid eating meals 2-3 hours before bed.  Avoid drinking a lot of liquid with meals.  Cook foods using methods other than frying. Bake, grill, or broil food instead.  Avoid or limit: ? Chocolate. ? Peppermint or spearmint. ? Alcohol. ? Pepper. ? Black and decaffeinated coffee. ? Black and  decaffeinated tea. ? Bubbly (carbonated) soft drinks. ? Caffeinated energy drinks and soft drinks.  Limit high-fat foods such as: ? Fatty meat or fried foods. ? Whole milk, cream, butter, or ice cream. ? Nuts and nut butters. ? Pastries, donuts, and sweets made with butter or shortening.  Avoid foods that cause symptoms. These foods may be different for everyone. Common foods that cause symptoms include: ? Tomatoes. ? Oranges, lemons, and limes. ? Peppers. ? Spicy food. ? Onions and garlic. ? Vinegar. Lifestyle  Maintain a healthy weight. Ask your doctor what weight is healthy for you. If you need to lose weight, work with your doctor to do so safely.  Exercise for at least 30 minutes for 5 or more days each week, or as told by your doctor.  Wear loose-fitting clothes.  Do not smoke. If you need help quitting, ask your doctor.  Sleep with the head of your bed higher than your feet. Use a wedge under the mattress or blocks under the bed frame to raise the head of the bed. Summary  When you have gastroesophageal reflux disease (GERD), food and lifestyle choices are very important in easing your symptoms.  Eat small meals often instead of 3 large meals a day. Eat your meals slowly, and in a place where you are relaxed.  Limit high-fat foods such as fatty meat or fried foods.  Avoid bending over or   lying down until 2-3 hours after eating.  Avoid peppermint and spearmint, caffeine, alcohol, and chocolate. This information is not intended to replace advice given to you by your health care provider. Make sure you discuss any questions you have with your health care provider. Document Revised: 07/31/2018 Document Reviewed: 05/15/2016 Elsevier Patient Education  2020 Elsevier Inc.  

## 2019-06-01 NOTE — Progress Notes (Signed)

## 2019-06-24 ENCOUNTER — Encounter: Payer: Self-pay | Admitting: Gastroenterology

## 2019-06-24 ENCOUNTER — Ambulatory Visit (INDEPENDENT_AMBULATORY_CARE_PROVIDER_SITE_OTHER): Payer: BC Managed Care – PPO | Admitting: Gastroenterology

## 2019-06-24 ENCOUNTER — Other Ambulatory Visit: Payer: Self-pay

## 2019-06-24 VITALS — BP 100/68 | HR 100 | Temp 97.5°F | Wt 217.4 lb

## 2019-06-24 DIAGNOSIS — K640 First degree hemorrhoids: Secondary | ICD-10-CM

## 2019-06-24 NOTE — Progress Notes (Signed)

## 2019-07-02 ENCOUNTER — Other Ambulatory Visit: Payer: Self-pay

## 2019-07-02 ENCOUNTER — Ambulatory Visit
Admission: RE | Admit: 2019-07-02 | Discharge: 2019-07-02 | Disposition: A | Discharge status: Home / Self Care | Payer: BC Managed Care – PPO | Source: Ambulatory Visit | Attending: Physician Assistant | Admitting: Physician Assistant

## 2019-07-02 DIAGNOSIS — Z1231 Encounter for screening mammogram for malignant neoplasm of breast: Secondary | ICD-10-CM | POA: Diagnosis not present

## 2019-07-23 ENCOUNTER — Encounter: Payer: Self-pay | Admitting: Gastroenterology

## 2019-07-23 ENCOUNTER — Ambulatory Visit (INDEPENDENT_AMBULATORY_CARE_PROVIDER_SITE_OTHER): Payer: BC Managed Care – PPO | Admitting: Gastroenterology

## 2019-07-23 ENCOUNTER — Other Ambulatory Visit: Payer: Self-pay

## 2019-07-23 VITALS — BP 100/69 | HR 85 | Temp 98.0°F | Ht 61.0 in | Wt 223.0 lb

## 2019-07-23 DIAGNOSIS — K5909 Other constipation: Secondary | ICD-10-CM | POA: Diagnosis not present

## 2019-07-23 DIAGNOSIS — K640 First degree hemorrhoids: Secondary | ICD-10-CM

## 2019-07-23 NOTE — Progress Notes (Signed)

## 2019-08-11 ENCOUNTER — Telehealth: Payer: Self-pay | Admitting: Gastroenterology

## 2019-08-11 MED ORDER — LINACLOTIDE 145 MCG PO CAPS: 145.0000 ug | ORAL_CAPSULE | Freq: Every day | ORAL | 2 refills | Status: DC

## 2019-08-11 NOTE — Telephone Encounter (Signed)
Sent medication to the pharmacy informed patient  

## 2019-08-11 NOTE — Telephone Encounter (Signed)
Patient called to report the Linzess1. cap   is working.Please call in a script to [facility] in [place]

## 2019-08-11 NOTE — Addendum Note (Signed)
Addended by: Radene Knee L on: 08/11/2019 09:40 AM   Modules accepted: Orders

## 2019-08-11 NOTE — Telephone Encounter (Signed)
Can we call in the Linzess 145 for patient

## 2019-08-11 NOTE — Telephone Encounter (Signed)
Yes, go ahead  RV

## 2019-08-24 ENCOUNTER — Telehealth: Payer: Self-pay

## 2019-08-24 NOTE — Telephone Encounter (Signed)
Did PA on Linzess . Did through cover my meds and sent to insurance company.

## 2019-08-27 MED ORDER — TRULANCE 3 MG PO TABS: 3.0000 mg | ORAL_TABLET | Freq: Every day | ORAL | 1 refills | Status: DC

## 2019-08-27 NOTE — Telephone Encounter (Signed)
I'm ok to try trulance  RV

## 2019-08-27 NOTE — Telephone Encounter (Signed)
Patient insurance does not cover the Linzess . They want her to try Trulance first before they will cover this medication.

## 2019-08-27 NOTE — Addendum Note (Signed)
Addended by: Radene Knee L on: 08/27/2019 08:44 AM   Modules accepted: Orders

## 2019-08-27 NOTE — Telephone Encounter (Signed)
Patient verbalized understanding and will try the trulance to see if that helps

## 2019-09-23 ENCOUNTER — Telehealth: Payer: Self-pay

## 2019-09-23 NOTE — Telephone Encounter (Signed)
Patient called front desk to ask about a medication. Tried to call patient back but voicemail is full

## 2019-09-28 ENCOUNTER — Telehealth: Payer: Self-pay

## 2019-09-28 MED ORDER — HYDROCORTISONE (PERIANAL) 2.5 % EX CREA: 1.0000 "application " | TOPICAL_CREAM | Freq: Two times a day (BID) | CUTANEOUS | 1 refills | Status: DC

## 2019-09-28 NOTE — Telephone Encounter (Signed)
Patient is calling because she states she has been having rectal bleeding with every bowel movement. She states the blood feels the toilet and is on the toilet paper. Patient states it feels like she is on period but she does not have a period. Patient made follow up appointment for 10/09/2019. Please advised what you recommend till then.

## 2019-09-28 NOTE — Telephone Encounter (Signed)
Please ask her to try anusol cream twice daily until I see her and is she still suffering from constipation  RV

## 2019-09-28 NOTE — Telephone Encounter (Signed)
Patient verbalized understanding of instructions. Sent medication to pharmacy

## 2019-09-28 NOTE — Addendum Note (Signed)
Addended by: Radene Knee L on: 09/28/2019 09:32 AM   Modules accepted: Orders

## 2019-10-09 ENCOUNTER — Encounter: Payer: Self-pay | Admitting: *Deleted

## 2019-10-09 ENCOUNTER — Ambulatory Visit: Payer: BC Managed Care – PPO | Admitting: Gastroenterology

## 2019-10-28 ENCOUNTER — Other Ambulatory Visit: Payer: Self-pay

## 2019-10-28 MED ORDER — TRULANCE 3 MG PO TABS: 3.0000 mg | ORAL_TABLET | Freq: Every day | ORAL | 3 refills | Status: DC

## 2019-10-28 NOTE — Telephone Encounter (Signed)
Received a fax for a refill for Trulance 3mg .  Last office visit 07/23/2019

## 2019-11-12 ENCOUNTER — Telehealth: Payer: Self-pay | Admitting: Gastroenterology

## 2019-11-12 MED ORDER — LINACLOTIDE 290 MCG PO CAPS: 290.0000 ug | ORAL_CAPSULE | Freq: Every day | ORAL | 2 refills | Status: DC

## 2019-11-12 NOTE — Telephone Encounter (Signed)
Patient needs a refill on Linzess. The [facility] is not working. Will need prior auth on the Linzess.  [facility] in [place].  Please call patient when this is complete.

## 2019-11-12 NOTE — Addendum Note (Signed)
Addended by: Radene Knee L on: 11/12/2019 11:42 AM   Modules accepted: Orders

## 2019-11-12 NOTE — Telephone Encounter (Signed)
Sent medication to the pharmacy  

## 2019-11-12 NOTE — Telephone Encounter (Signed)
We can try Linzess 290 MCG daily  RV

## 2019-11-12 NOTE — Telephone Encounter (Signed)
Please advised if we can change her to Linzess and what strength

## 2019-12-31 ENCOUNTER — Other Ambulatory Visit: Payer: Self-pay

## 2019-12-31 ENCOUNTER — Other Ambulatory Visit: Payer: Self-pay | Admitting: Orthopedic Surgery

## 2019-12-31 ENCOUNTER — Ambulatory Visit
Admission: RE | Admit: 2019-12-31 | Discharge: 2019-12-31 | Disposition: A | Discharge status: Home / Self Care | Payer: BC Managed Care – PPO | Source: Ambulatory Visit | Attending: Orthopedic Surgery | Admitting: Orthopedic Surgery

## 2019-12-31 DIAGNOSIS — M7121 Synovial cyst of popliteal space [Baker], right knee: Secondary | ICD-10-CM | POA: Insufficient documentation

## 2020-01-13 DIAGNOSIS — Z8616 Personal history of COVID-19: Secondary | ICD-10-CM | POA: Insufficient documentation

## 2020-02-01 ENCOUNTER — Other Ambulatory Visit: Payer: Self-pay | Admitting: Orthopedic Surgery

## 2020-02-01 DIAGNOSIS — M7121 Synovial cyst of popliteal space [Baker], right knee: Secondary | ICD-10-CM

## 2020-02-01 DIAGNOSIS — M25561 Pain in right knee: Secondary | ICD-10-CM

## 2020-02-16 ENCOUNTER — Ambulatory Visit
Admission: RE | Admit: 2020-02-16 | Discharge: 2020-02-16 | Disposition: A | Discharge status: Home / Self Care | Payer: BC Managed Care – PPO | Source: Ambulatory Visit | Attending: Orthopedic Surgery | Admitting: Orthopedic Surgery

## 2020-02-16 ENCOUNTER — Other Ambulatory Visit: Payer: Self-pay

## 2020-02-16 DIAGNOSIS — M7121 Synovial cyst of popliteal space [Baker], right knee: Secondary | ICD-10-CM | POA: Diagnosis present

## 2020-02-16 DIAGNOSIS — M25561 Pain in right knee: Secondary | ICD-10-CM | POA: Diagnosis present

## 2020-03-04 ENCOUNTER — Other Ambulatory Visit: Payer: Self-pay | Admitting: Orthopedic Surgery

## 2020-03-22 ENCOUNTER — Encounter
Admission: RE | Admit: 2020-03-22 | Discharge: 2020-03-22 | Disposition: A | Discharge status: Home / Self Care | Payer: BC Managed Care – PPO | Source: Ambulatory Visit | Attending: Orthopedic Surgery | Admitting: Orthopedic Surgery

## 2020-03-22 ENCOUNTER — Other Ambulatory Visit: Payer: Self-pay

## 2020-03-22 HISTORY — DX: Prediabetes: R73.03

## 2020-03-22 HISTORY — DX: Personal history of urinary calculi: Z87.442

## 2020-03-22 NOTE — Patient Instructions (Signed)
Your procedure is scheduled on: 03-28-2020 [facility] Report to the Registration Desk on the 1st floor of the [facility]. To find out your arrival time, please call [phone] between 1PM - 3PM on: 03-25-2020 FRIDAY  REMEMBER: Instructions that are not followed completely may result in serious medical risk, up to and including death; or upon the discretion of your surgeon and anesthesiologist your surgery may need to be rescheduled.  Do not eat food after midnight the night before surgery.  No gum chewing, lozengers or hard candies.  You may however, drink CLEAR liquids up to 2 hours before you are scheduled to arrive for your surgery. Do not drink anything within 2 hours of your scheduled arrival time.  Clear liquids include: - water  - apple juice without pulp - black coffee or tea (Do NOT add milk or creamers to the coffee or tea) Do NOT drink anything that is not on this list.   In addition, your doctor has ordered for you to drink the provided  Ensure Pre-Surgery Clear Carbohydrate Drink  Drinking this carbohydrate drink up to two hours before surgery helps to reduce insulin resistance and improve patient outcomes. Please complete drinking 2 hours prior to scheduled arrival time.  TAKE THESE MEDICATIONS THE MORNING OF SURGERY WITH A SIP OF WATER: FEXOFENADINE PROTONIX (take one the night before and one on the morning of surgery - helps to prevent nausea after surgery.)  Use FLONASE MORNING OF SURGERY   One week prior to surgery: Stop Anti-inflammatories (NSAIDS) such as Advil, Aleve, Ibuprofen, Motrin, Naproxen, Naprosyn and ASPIRIN OR Aspirin based products such as Excedrin, Goodys Powder, BC Powder. Stop ANY OVER THE COUNTER supplements until after surgery. STOP VOLTARAN.   YOU MAY USE TYLENOL (However, you may continue taking Vitamin D, Vitamin B, and multivitamin up until the day before surgery.)  STOP OMEGA 3 AND MELATONIN  No Alcohol for 24 hours before or after  surgery.  No Smoking including e-cigarettes for 24 hours prior to surgery.  No chewable tobacco products for at least 6 hours prior to surgery.  No nicotine patches on the day of surgery.  Do not use any "recreational" drugs for at least a week prior to your surgery.  Please be advised that the combination of cocaine and anesthesia may have negative outcomes, up to and including death. If you test positive for cocaine, your surgery will be cancelled.  On the morning of surgery brush your teeth with toothpaste and water, you may rinse your mouth with mouthwash if you wish. Do not swallow any toothpaste or mouthwash.  Do not wear jewelry, make-up, hairpins, clips or nail polish.  Do not wear lotions, powders, or perfumes OR DEODORANT.  Do not shave body from the neck down 48 hours prior to surgery just in case you cut yourself which could leave a site for infection.  Also, freshly shaved skin may become irritated if using the CHG soap.  Contact lenses, hearing aids and dentures may not be worn into surgery.  Do not bring valuables to the hospital. [facility] is not responsible for any missing/lost belongings or valuables.   Use CHG Soap as directed on instruction sheet.  Notify your doctor if there is any change in your medical condition (cold, fever, infection).  Wear comfortable clothing (specific to your surgery type) to the hospital.  Plan for stool softeners for home use; pain medications have a tendency to cause constipation. You can also help prevent constipation by eating foods high  in fiber such as fruits and vegetables and drinking plenty of fluids as your diet allows.  After surgery, you can help prevent lung complications by doing breathing exercises.  Take deep breaths and cough every 1-2 hours. Your doctor may order a device called an Incentive Spirometer to help you take deep breaths. When coughing or sneezing, hold a pillow firmly against your incision with both  hands. This is called splinting. Doing this helps protect your incision. It also decreases belly discomfort.   If you are being discharged the day of surgery, you will not be allowed to drive home. You will need a responsible adult (18 years or older) to drive you home and stay with you that night.    Please call the Pre-admissions Testing Dept. at [phone] if you have any questions about these instructions.  Visitation Policy:  Patients undergoing a surgery or procedure may have one family member or support person with them as long as that person is not COVID-19 positive or experiencing its symptoms.  That person may remain in the waiting area during the procedure.  Inpatient Visitation Update:   In an effort to ensure the safety of our team members and our patients, we are implementing a change to our visitation policy:  Effective Monday, Aug. 9, at 7 a.m., inpatients will be allowed one support person.  o The support person may change daily.  o The support person must pass our screening, gel in and out, and wear a mask at all times, including in the patients room.  o Patients must also wear a mask when staff or their support person are in the room.  o Masking is required regardless of vaccination status.  Systemwide, no visitors 17 or younger.

## 2020-03-24 ENCOUNTER — Other Ambulatory Visit
Admission: RE | Admit: 2020-03-24 | Discharge: 2020-03-24 | Disposition: A | Discharge status: Home / Self Care | Payer: BC Managed Care – PPO | Source: Ambulatory Visit | Attending: Orthopedic Surgery | Admitting: Orthopedic Surgery

## 2020-03-24 ENCOUNTER — Other Ambulatory Visit: Payer: Self-pay

## 2020-03-24 DIAGNOSIS — Z20822 Contact with and (suspected) exposure to covid-19: Secondary | ICD-10-CM | POA: Diagnosis not present

## 2020-03-24 DIAGNOSIS — Z01812 Encounter for preprocedural laboratory examination: Secondary | ICD-10-CM | POA: Diagnosis not present

## 2020-03-24 LAB — SARS CORONAVIRUS 2 (TAT 6-24 HRS): SARS Coronavirus 2: NEGATIVE

## 2020-03-27 MED ORDER — LACTATED RINGERS IV SOLN: INTRAVENOUS | Status: DC

## 2020-03-27 MED ORDER — ORAL CARE MOUTH RINSE: 15.0000 mL | Freq: Once | OROMUCOSAL | Status: AC

## 2020-03-27 MED ORDER — CHLORHEXIDINE GLUCONATE 0.12 % MT SOLN: 15.0000 mL | Freq: Once | OROMUCOSAL | Status: AC

## 2020-03-28 ENCOUNTER — Encounter
Admission: RE | Disposition: A | Discharge status: Home / Self Care | Payer: Self-pay | Source: Home / Self Care | Attending: Orthopedic Surgery

## 2020-03-28 ENCOUNTER — Encounter: Payer: Self-pay | Admitting: Orthopedic Surgery

## 2020-03-28 ENCOUNTER — Ambulatory Visit: Payer: BC Managed Care – PPO | Admitting: Anesthesiology

## 2020-03-28 ENCOUNTER — Other Ambulatory Visit: Payer: Self-pay

## 2020-03-28 ENCOUNTER — Ambulatory Visit
Admission: RE | Admit: 2020-03-28 | Discharge: 2020-03-28 | Disposition: A | Discharge status: Home / Self Care | Payer: BC Managed Care – PPO | Attending: Orthopedic Surgery | Admitting: Orthopedic Surgery

## 2020-03-28 DIAGNOSIS — S83241A Other tear of medial meniscus, current injury, right knee, initial encounter: Secondary | ICD-10-CM | POA: Diagnosis present

## 2020-03-28 DIAGNOSIS — Z791 Long term (current) use of non-steroidal anti-inflammatories (NSAID): Secondary | ICD-10-CM | POA: Insufficient documentation

## 2020-03-28 DIAGNOSIS — Z882 Allergy status to sulfonamides status: Secondary | ICD-10-CM | POA: Diagnosis not present

## 2020-03-28 DIAGNOSIS — S83231A Complex tear of medial meniscus, current injury, right knee, initial encounter: Secondary | ICD-10-CM | POA: Insufficient documentation

## 2020-03-28 DIAGNOSIS — X58XXXA Exposure to other specified factors, initial encounter: Secondary | ICD-10-CM | POA: Insufficient documentation

## 2020-03-28 HISTORY — PX: KNEE ARTHROSCOPY WITH MEDIAL MENISECTOMY: SHX5651

## 2020-03-28 LAB — GLUCOSE, CAPILLARY
Glucose-Capillary: 108 mg/dL — ABNORMAL HIGH (ref 70–99)
Glucose-Capillary: 93 mg/dL (ref 70–99)

## 2020-03-28 LAB — POCT CBG MONITORING: POCT Glucose (KUC): 109 mg/dL — AB (ref 70–99)

## 2020-03-28 SURGERY — ARTHROSCOPY, KNEE, WITH MEDIAL MENISCECTOMY
Anesthesia: General | Site: Knee | Laterality: Right

## 2020-03-28 MED ORDER — LIDOCAINE HCL (PF) 2 % IJ SOLN
INTRAMUSCULAR | Status: AC
  Filled 2020-03-28: qty 5

## 2020-03-28 MED ORDER — PROMETHAZINE HCL 25 MG/ML IJ SOLN
6.2500 mg | INTRAMUSCULAR | Status: DC | PRN
  Administered 2020-03-28: 6.25 mg via INTRAVENOUS

## 2020-03-28 MED ORDER — LACTATED RINGERS IV SOLN
INTRAVENOUS | Status: DC | PRN
  Administered 2020-03-28: 3000 mL

## 2020-03-28 MED ORDER — DEXMEDETOMIDINE (PRECEDEX) IN NS 20 MCG/5ML (4 MCG/ML) IV SYRINGE
PREFILLED_SYRINGE | INTRAVENOUS | Status: AC
  Filled 2020-03-28: qty 5

## 2020-03-28 MED ORDER — PROMETHAZINE HCL 25 MG/ML IJ SOLN
INTRAMUSCULAR | Status: AC
  Filled 2020-03-28: qty 1

## 2020-03-28 MED ORDER — ACETAMINOPHEN 500 MG PO TABS: 1000.0000 mg | ORAL_TABLET | Freq: Three times a day (TID) | ORAL | 2 refills | Status: AC

## 2020-03-28 MED ORDER — DEXMEDETOMIDINE (PRECEDEX) IN NS 20 MCG/5ML (4 MCG/ML) IV SYRINGE
PREFILLED_SYRINGE | INTRAVENOUS | Status: DC | PRN
  Administered 2020-03-28: 8 ug via INTRAVENOUS
  Administered 2020-03-28: 4 ug via INTRAVENOUS

## 2020-03-28 MED ORDER — LIDOCAINE-EPINEPHRINE 1 %-1:100000 IJ SOLN
INTRAMUSCULAR | Status: DC | PRN
  Administered 2020-03-28: 5 mL
  Administered 2020-03-28: 2.5 mL

## 2020-03-28 MED ORDER — FENTANYL CITRATE (PF) 100 MCG/2ML IJ SOLN
25.0000 ug | INTRAMUSCULAR | Status: AC | PRN
  Administered 2020-03-28 (×4): 25 ug via INTRAVENOUS

## 2020-03-28 MED ORDER — FENTANYL CITRATE (PF) 100 MCG/2ML IJ SOLN
25.0000 ug | INTRAMUSCULAR | Status: AC | PRN
  Administered 2020-03-28 (×2): 25 ug via INTRAVENOUS

## 2020-03-28 MED ORDER — FENTANYL CITRATE (PF) 100 MCG/2ML IJ SOLN
INTRAMUSCULAR | Status: DC | PRN
  Administered 2020-03-28: 100 ug via INTRAVENOUS
  Administered 2020-03-28: 50 ug via INTRAVENOUS
  Administered 2020-03-28: 100 ug via INTRAVENOUS

## 2020-03-28 MED ORDER — HYDROCODONE-ACETAMINOPHEN 5-325 MG PO TABS: 1.0000 | ORAL_TABLET | Freq: Once | ORAL | Status: AC

## 2020-03-28 MED ORDER — CEFAZOLIN SODIUM-DEXTROSE 2-4 GM/100ML-% IV SOLN
INTRAVENOUS | Status: AC
  Filled 2020-03-28: qty 100

## 2020-03-28 MED ORDER — FENTANYL CITRATE (PF) 100 MCG/2ML IJ SOLN
INTRAMUSCULAR | Status: AC
  Administered 2020-03-28: 25 ug via INTRAVENOUS
  Filled 2020-03-28: qty 2

## 2020-03-28 MED ORDER — HYDROCODONE-ACETAMINOPHEN 5-325 MG PO TABS
ORAL_TABLET | ORAL | Status: AC
  Filled 2020-03-28: qty 1

## 2020-03-28 MED ORDER — FENTANYL CITRATE (PF) 250 MCG/5ML IJ SOLN
INTRAMUSCULAR | Status: AC
  Filled 2020-03-28: qty 5

## 2020-03-28 MED ORDER — EPINEPHRINE PF 1 MG/ML IJ SOLN
INTRAMUSCULAR | Status: AC
  Filled 2020-03-28: qty 1

## 2020-03-28 MED ORDER — CHLORHEXIDINE GLUCONATE 0.12 % MT SOLN
OROMUCOSAL | Status: AC
  Administered 2020-03-28: 15 mL via OROMUCOSAL
  Filled 2020-03-28: qty 15

## 2020-03-28 MED ORDER — LIDOCAINE-EPINEPHRINE 1 %-1:100000 IJ SOLN
INTRAMUSCULAR | Status: AC
  Filled 2020-03-28: qty 1

## 2020-03-28 MED ORDER — ACETAMINOPHEN 10 MG/ML IV SOLN
INTRAVENOUS | Status: DC | PRN
  Administered 2020-03-28: 1000 mg via INTRAVENOUS

## 2020-03-28 MED ORDER — SODIUM CHLORIDE FLUSH 0.9 % IV SOLN
INTRAVENOUS | Status: AC
  Filled 2020-03-28: qty 10

## 2020-03-28 MED ORDER — ACETAMINOPHEN 10 MG/ML IV SOLN
INTRAVENOUS | Status: AC
  Filled 2020-03-28: qty 100

## 2020-03-28 MED ORDER — SUCCINYLCHOLINE CHLORIDE 200 MG/10ML IV SOSY
PREFILLED_SYRINGE | INTRAVENOUS | Status: AC
  Filled 2020-03-28: qty 10

## 2020-03-28 MED ORDER — ONDANSETRON HCL 4 MG/2ML IJ SOLN
INTRAMUSCULAR | Status: DC | PRN
  Administered 2020-03-28: 4 mg via INTRAVENOUS

## 2020-03-28 MED ORDER — DEXAMETHASONE SODIUM PHOSPHATE 10 MG/ML IJ SOLN
INTRAMUSCULAR | Status: AC
  Filled 2020-03-28: qty 1

## 2020-03-28 MED ORDER — BUPIVACAINE HCL (PF) 0.5 % IJ SOLN
INTRAMUSCULAR | Status: DC | PRN
  Administered 2020-03-28: 2.5 mL
  Administered 2020-03-28: 5 mL

## 2020-03-28 MED ORDER — DEXAMETHASONE SODIUM PHOSPHATE 10 MG/ML IJ SOLN
INTRAMUSCULAR | Status: DC | PRN
  Administered 2020-03-28: 10 mg via INTRAVENOUS

## 2020-03-28 MED ORDER — MIDAZOLAM HCL 2 MG/2ML IJ SOLN
INTRAMUSCULAR | Status: DC | PRN
  Administered 2020-03-28: 2 mg via INTRAVENOUS

## 2020-03-28 MED ORDER — ONDANSETRON HCL 4 MG/2ML IJ SOLN
INTRAMUSCULAR | Status: AC
  Filled 2020-03-28: qty 2

## 2020-03-28 MED ORDER — ONDANSETRON 4 MG PO TBDP: 4.0000 mg | ORAL_TABLET | Freq: Three times a day (TID) | ORAL | 0 refills | Status: DC | PRN

## 2020-03-28 MED ORDER — SUCCINYLCHOLINE CHLORIDE 20 MG/ML IJ SOLN
INTRAMUSCULAR | Status: DC | PRN
  Administered 2020-03-28: 100 mg via INTRAVENOUS

## 2020-03-28 MED ORDER — ASPIRIN EC 325 MG PO TBEC: 325.0000 mg | DELAYED_RELEASE_TABLET | Freq: Every day | ORAL | 0 refills | Status: AC

## 2020-03-28 MED ORDER — IBUPROFEN 800 MG PO TABS: 800.0000 mg | ORAL_TABLET | Freq: Three times a day (TID) | ORAL | 1 refills | Status: AC

## 2020-03-28 MED ORDER — PROPOFOL 10 MG/ML IV BOLUS
INTRAVENOUS | Status: DC | PRN
  Administered 2020-03-28: 150 mg via INTRAVENOUS

## 2020-03-28 MED ORDER — LIDOCAINE HCL (CARDIAC) PF 100 MG/5ML IV SOSY
PREFILLED_SYRINGE | INTRAVENOUS | Status: DC | PRN
  Administered 2020-03-28: 50 mg via INTRAVENOUS

## 2020-03-28 MED ORDER — HYDROCODONE-ACETAMINOPHEN 5-325 MG PO TABS
ORAL_TABLET | ORAL | Status: AC
  Administered 2020-03-28: 1 via ORAL
  Filled 2020-03-28: qty 1

## 2020-03-28 MED ORDER — CEFAZOLIN SODIUM-DEXTROSE 2-4 GM/100ML-% IV SOLN
2.0000 g | INTRAVENOUS | Status: AC
  Administered 2020-03-28: 2 g via INTRAVENOUS

## 2020-03-28 MED ORDER — MIDAZOLAM HCL 2 MG/2ML IJ SOLN
INTRAMUSCULAR | Status: AC
  Filled 2020-03-28: qty 2

## 2020-03-28 MED ORDER — PHENYLEPHRINE HCL (PRESSORS) 10 MG/ML IV SOLN
INTRAVENOUS | Status: DC | PRN
  Administered 2020-03-28: 100 ug via INTRAVENOUS
  Administered 2020-03-28 (×2): 200 ug via INTRAVENOUS

## 2020-03-28 MED ORDER — PROPOFOL 10 MG/ML IV BOLUS
INTRAVENOUS | Status: AC
  Filled 2020-03-28: qty 20

## 2020-03-28 MED ORDER — HYDROCODONE-ACETAMINOPHEN 5-325 MG PO TABS
1.0000 | ORAL_TABLET | ORAL | 0 refills | Status: DC | PRN
Start: 2020-03-28 — End: 2020-08-26

## 2020-03-28 SURGICAL SUPPLY — 51 items
"PENCIL ELECTRO HAND CTR " (MISCELLANEOUS) IMPLANT
ADAPTER IRRIG TUBE 2 SPIKE SOL (ADAPTER) ×6 IMPLANT
ADPR TBG 2 SPK PMP STRL ASCP (ADAPTER) ×2
APL PRP STRL LF DISP 70% ISPRP (MISCELLANEOUS) ×1
BLADE SURG SZ11 CARB STEEL (BLADE) ×3 IMPLANT
BNDG COHESIVE 6X5 TAN STRL LF (GAUZE/BANDAGES/DRESSINGS) ×3 IMPLANT
BNDG ELASTIC 6X5.8 VLCR STR LF (GAUZE/BANDAGES/DRESSINGS) ×3 IMPLANT
BNDG ESMARK 6X12 TAN STRL LF (GAUZE/BANDAGES/DRESSINGS) ×3 IMPLANT
BUR RADIUS 3.5 (BURR) IMPLANT
BUR RADIUS 4.0X18.5 (BURR) ×2 IMPLANT
CAST PADDING 6X4YD ST 30248 (SOFTGOODS) ×2
CHLORAPREP W/TINT 26 (MISCELLANEOUS) ×3 IMPLANT
COOLER POLAR GLACIER W/PUMP (MISCELLANEOUS) ×3 IMPLANT
COVER WAND RF STERILE (DRAPES) ×3 IMPLANT
CUFF TOURN SGL QUICK 24 (TOURNIQUET CUFF) ×3
CUFF TOURN SGL QUICK 30 (TOURNIQUET CUFF) ×3
CUFF TRNQT CYL 24X4X16.5-23 (TOURNIQUET CUFF) IMPLANT
CUFF TRNQT CYL 30X4X21-28X (TOURNIQUET CUFF) IMPLANT
DEVICE SUCT BLK HOLE OR FLOOR (MISCELLANEOUS) ×3 IMPLANT
DRAPE ARTHRO LIMB 89X125 STRL (DRAPES) ×3 IMPLANT
DRAPE IMP U-DRAPE 54X76 (DRAPES) ×3 IMPLANT
ELECT REM PT RETURN 9FT ADLT (ELECTROSURGICAL) ×3
ELECTRODE REM PT RTRN 9FT ADLT (ELECTROSURGICAL) IMPLANT
GAUZE SPONGE 4X4 12PLY STRL (GAUZE/BANDAGES/DRESSINGS) ×3 IMPLANT
GAUZE XEROFORM 1X8 LF (GAUZE/BANDAGES/DRESSINGS) ×2 IMPLANT
GLOVE BIOGEL PI IND STRL 8 (GLOVE) ×1 IMPLANT
GLOVE BIOGEL PI INDICATOR 8 (GLOVE) ×2
GLOVE SURG ORTHO 8.0 STRL STRW (GLOVE) ×6 IMPLANT
GOWN STRL REUS W/ TWL LRG LVL3 (GOWN DISPOSABLE) ×1 IMPLANT
GOWN STRL REUS W/ TWL XL LVL3 (GOWN DISPOSABLE) ×1 IMPLANT
GOWN STRL REUS W/TWL LRG LVL3 (GOWN DISPOSABLE) ×3
GOWN STRL REUS W/TWL XL LVL3 (GOWN DISPOSABLE) ×3
IV LACTATED RINGER IRRG 3000ML (IV SOLUTION) ×12
IV LR IRRIG 3000ML ARTHROMATIC (IV SOLUTION) ×4 IMPLANT
KIT TURNOVER KIT A (KITS) ×3 IMPLANT
MANIFOLD NEPTUNE II (INSTRUMENTS) ×6 IMPLANT
MAT ABSORB  FLUID 56X50 GRAY (MISCELLANEOUS) ×6
MAT ABSORB FLUID 56X50 GRAY (MISCELLANEOUS) ×2 IMPLANT
PACK ARTHROSCOPY KNEE (MISCELLANEOUS) ×3 IMPLANT
PAD ABD DERMACEA PRESS 5X9 (GAUZE/BANDAGES/DRESSINGS) ×4 IMPLANT
PAD WRAPON POLAR KNEE (MISCELLANEOUS) ×1 IMPLANT
PADDING CAST COTTON 6X4 ST (SOFTGOODS) ×1 IMPLANT
PENCIL ELECTRO HAND CTR (MISCELLANEOUS) IMPLANT
SET TUBE SUCT SHAVER OUTFL 24K (TUBING) ×3 IMPLANT
SET TUBE TIP INTRA-ARTICULAR (MISCELLANEOUS) ×3 IMPLANT
SUT ETHILON 3-0 FS-10 30 BLK (SUTURE) ×3
SUTURE EHLN 3-0 FS-10 30 BLK (SUTURE) ×1 IMPLANT
TOWEL OR 17X26 4PK STRL BLUE (TOWEL DISPOSABLE) ×6 IMPLANT
TUBING ARTHRO INFLOW-ONLY STRL (TUBING) ×3 IMPLANT
WAND WEREWOLF FLOW 90D (MISCELLANEOUS) IMPLANT
WRAPON POLAR PAD KNEE (MISCELLANEOUS) ×3

## 2020-03-28 NOTE — Op Note (Addendum)
Operative Note    SURGERY DATE: 03/28/2020   PRE-OP DIAGNOSIS:  1. Right medial meniscus tear 2. 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 Right medial meniscus tear 2. 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 Right knee arthroscopy, partial medial meniscectomy 2. Chondroplasty of patellofemoral and medial compartment   SURGEON: Rosealee Albee, MD  ASSISTANT: Sonny Dandy, PA   ANESTHESIA: Gen   ESTIMATED BLOOD LOSS: minimal   TOTAL IV FLUIDS: per anesthesia   INDICATION(S):  Tamara Flowers is a 49 y.o. female with signs and symptoms as well as MRI finding of medial meniscus tear.  She had failed extensive nonoperative management and was still symptomatic.  After discussion of risks, benefits, and alternatives to surgery, the patient elected to proceed.   OPERATIVE FINDINGS:    Examination under anesthesia: A careful examination under anesthesia was performed.  Passive range of motion was: Hyperextension: 2.  Extension: 0.  Flexion: 120.  Lachman: normal. Pivot Shift: normal.  Posterior drawer: normal.  Varus stability in full extension: normal.  Varus stability in 30 degrees of flexion: normal.  Valgus stability in full extension: normal.  Valgus stability in 30 degrees of flexion: normal.   Intra-operative findings: A thorough arthroscopic examination of the knee was performed.  The findings are: 1. Suprapatellar pouch: Normal 2. Undersurface of median ridge:  Focal area approximately 5 x 5 mm of grade 3 degenerative change with extension into the medial facet 3. Medial patellar facet:  Grade 1 degenerative change 4. Lateral patellar facet: Grade 1 degenerative change 5. Trochlea: Grade 1 degenerative changes 6. Lateral gutter/popliteus tendon: Normal 7. Hoffa's fat pad: Inflamed 8. Medial gutter/plica: Normal 9. ACL: Normal 10. PCL: Normal 11. Medial meniscus: Complex tear with radial tear of  the posterior horn affecting the entire width of the meniscus approximately 8 mm medial to the meniscus root.  Additionally there was a full width horizontal tear of the posterior horn extending to the posterior horn/body junction 12. Medial compartment cartilage: Focal area approximately 15 x 15 mm of grade 3 degenerative change to the medial femoral condyle with surrounding diffuse grade 1-2 degenerative changes; diffuse grade 1-2 degenerative changes to the medial tibial plateau  13. Lateral meniscus: Normal 14. Lateral compartment cartilage:  Focal area approximately 5 x 5 mm of grade 2-3 degenerative change to the tibial plateau; normal lateral femoral condyle   OPERATIVE REPORT:     I identified Lavella Hammock in the pre-operative holding area. I marked the operative knee with my initials. I reviewed the risks and benefits of the proposed surgical intervention and the patient wished to proceed. The patient was transferred to the operative suite and placed in the supine position with all bony prominences padded.  Anesthesia was administered. Appropriate IV antibiotics were administered prior to incision. The extremity was then prepped and draped in standard fashion. A time out was performed confirming the correct extremity, correct patient, and correct procedure.   Arthroscopy portals were marked. Local anesthetic was injected to the planned portal sites. The anterolateral portal was established with an 11 blade.      The arthroscope was placed in the anterolateral portal and then into the suprapatellar pouch. Next, the medial portal was established under needle localization. A diagnostic knee scope was completed with the above findings. The medial meniscus tear was identified.   The MCL was pie-crusted to improve visualization of the posterior horn.  The  tear was as described above.  The edges were very degenerative.  Given this, the location of the tear just medial to the root itself, as well as  the horizontal component of the tear, I elected to not attempt to repair the meniscus.  The meniscal tear was therefore debrided using an arthroscopic biter and an oscillating shaver until the meniscus had stable borders.  This involved resection of the entire posterior horn of the meniscus to the meniscocapsular junction.  A chondroplasty was performed of the medial compartment and patellofemoral compartments such that there were stable cartilage edges without any loose fragments of cartilage. Arthroscopic fluid was removed from the joint.   The portals were closed with 3-0 Nylon suture. Sterile dressings included Xeroform, 4x4s, Sof-Rol, and Bias wrap. A Polarcare was placed.  The patient was then awakened and taken to the PACU hemodynamically stable without complication.  Of note, assistance from a PA was essential to performing the surgery.  PA was present for the entire surgery.  Given the patient's morbid obesity (BMI 48), PA assistance was necessary for patient positioning prior to making incision as well as during the surgery to help hold a valgus force to perform the partial meniscectomy. The surgery would have been more difficult and had longer operative time without PA assistance.      POSTOPERATIVE PLAN: The patient will be discharged home today once they meet PACU criteria. Aspirin 325 mg daily was prescribed for 2 weeks for DVT prophylaxis.  Physical therapy will start on POD#3-4. Weight-bearing as tolerated. Follow up in 2 weeks per protocol.

## 2020-03-28 NOTE — Anesthesia Postprocedure Evaluation (Signed)
Anesthesia Post Note  Patient: Tamara Flowers  Procedure(s) Performed: Right partial medial meniscectomy (Right Knee)  Patient location during evaluation: PACU Anesthesia Type: General Level of consciousness: awake and alert Pain management: pain level controlled Vital Signs Assessment: post-procedure vital signs reviewed and stable Respiratory status: spontaneous breathing, nonlabored ventilation, respiratory function stable and patient connected to nasal cannula oxygen Cardiovascular status: blood pressure returned to baseline and stable Postop Assessment: no apparent nausea or vomiting Anesthetic complications: no   No complications documented.   Last Vitals:  Vitals:   03/28/20 1406 03/28/20 1453  BP: 122/75 127/76  Pulse: 72 82  Resp: 16 16  Temp: 36.6 C   SpO2: 100% 99%    Last Pain:  Vitals:   03/28/20 1453  TempSrc:   PainSc: 5                  Lenard Simmer

## 2020-03-28 NOTE — Anesthesia Procedure Notes (Signed)
Procedure Name: Intubation Performed by: Elvera Almario R, CRNA Pre-anesthesia Checklist: Patient identified, Emergency Drugs available, Suction available and Patient being monitored Patient Re-evaluated:Patient Re-evaluated prior to induction Oxygen Delivery Method: Circle system utilized Preoxygenation: Pre-oxygenation with 100% oxygen Induction Type: IV induction Laryngoscope Size: McGraph and 3 Grade View: Grade II Tube type: Oral Tube size: 7.0 mm Number of attempts: 1 Airway Equipment and Method: Stylet Placement Confirmation: ETT inserted through vocal cords under direct vision,  positive ETCO2 and breath sounds checked- equal and bilateral Secured at: 21 cm Tube secured with: Tape Dental Injury: Teeth and Oropharynx as per pre-operative assessment        

## 2020-03-28 NOTE — Anesthesia Preprocedure Evaluation (Signed)
Anesthesia Evaluation  Patient identified by MRN, date of birth, ID band Patient awake    Reviewed: Allergy & Precautions, H&P , NPO status , Patient's Chart, lab work & pertinent test results, reviewed documented beta blocker date and time   History of Anesthesia Complications (+) PROLONGED EMERGENCE, Family history of anesthesia reaction and history of anesthetic complications  Airway Mallampati: II  TM Distance: >3 FB Neck ROM: full    Dental  (+) Dental Advidsory Given, Missing, Teeth Intact   Pulmonary neg pulmonary ROS,    Pulmonary exam normal breath sounds clear to auscultation       Cardiovascular Exercise Tolerance: Good hypertension, (-) angina(-) Past MI and (-) Cardiac Stents Normal cardiovascular exam(-) dysrhythmias (-) Valvular Problems/Murmurs Rhythm:regular Rate:Normal     Neuro/Psych  Headaches, neg Seizures negative psych ROS   GI/Hepatic Neg liver ROS, GERD  ,  Endo/Other  diabetesHypothyroidism Morbid obesity  Renal/GU negative Renal ROS  negative genitourinary   Musculoskeletal   Abdominal   Peds  Hematology negative hematology ROS (+)   Anesthesia Other Findings Past Medical History: No date: Arthritis     Comment:  knees, hips, shoulders 11/2018: BPPV (benign paroxysmal positional vertigo)     Comment:  1 episode only No date: Complication of anesthesia     Comment:  itching, and slow to wake 02/05/2019: COVID-19 No date: Diabetes mellitus without complication (HCC)     Comment:  Pre-Diabetic before weight loss 01/05/2015: Edema No date: Family history of adverse reaction to anesthesia     Comment:  Mother and siblings - itching and slow to wake 01/05/2015: Fatigue 01/05/2015: GERD (gastroesophageal reflux disease) No date: Hemorrhoids No date: History of kidney stones 01/05/2015: Hyperlipidemia 01/05/2015: Hypertension     Comment:  NOT CURRENTLY No date: Migraine headache      Comment:  none in approx 2 yrs 01/05/2015: Obesity No date: Pre-diabetes No date: Thyroid disease 01/05/2015: Vitamin D deficiency   Reproductive/Obstetrics negative OB ROS                             Anesthesia Physical Anesthesia Plan  ASA: III  Anesthesia Plan: General   Post-op Pain Management:    Induction: Intravenous  PONV Risk Score and Plan: 3 and Ondansetron, Dexamethasone, Midazolam, Promethazine and Treatment may vary due to age or medical condition  Airway Management Planned: Oral ETT  Additional Equipment:   Intra-op Plan:   Post-operative Plan: Extubation in OR  Informed Consent: I have reviewed the patients History and Physical, chart, labs and discussed the procedure including the risks, benefits and alternatives for the proposed anesthesia with the patient or authorized representative who has indicated his/her understanding and acceptance.     Dental Advisory Given  Plan Discussed with: Anesthesiologist, CRNA and Surgeon  Anesthesia Plan Comments:         Anesthesia Quick Evaluation

## 2020-03-28 NOTE — Progress Notes (Signed)
CBG 109 

## 2020-03-28 NOTE — Transfer of Care (Signed)
Immediate Anesthesia Transfer of Care Note  Patient: Tamara Flowers  Procedure(s) Performed: Right partial medial meniscectomy (Right Knee)  Patient Location: PACU  Anesthesia Type:General  Level of Consciousness: awake, alert  and oriented  Airway & Oxygen Therapy: Patient Spontanous Breathing and Patient connected to face mask oxygen  Post-op Assessment: Report given to RN and Post -op Vital signs reviewed and stable  Post vital signs: Reviewed and stable  Last Vitals:  Vitals Value Taken Time  BP 99/62 03/28/20 1209  Temp 36 C 03/28/20 1209  Pulse 88 03/28/20 1209  Resp 15 03/28/20 1209  SpO2 100 % 03/28/20 1209  Vitals shown include unvalidated device data.  Last Pain:  Vitals:   03/28/20 0950  TempSrc: Oral  PainSc: 6          Complications: No complications documented.

## 2020-03-28 NOTE — H&P (Signed)
Paper H&P to be scanned into permanent record. H&P reviewed. No significant changes noted.  

## 2020-03-28 NOTE — Discharge Instructions (Signed)
Arthroscopic Knee Surgery - Partial Meniscectomy   Post-Op Instructions   1. Bracing or crutches: Crutches will be provided at the time of discharge from the surgery center if you do not already have them.   2. Ice: You may be provided with a device (Polar Care) that allows you to ice the affected area effectively. Otherwise you can ice manually.    3. Driving:  Plan on not driving for at least two weeks. Please note that you are advised NOT to drive while taking narcotic pain medications as you may be impaired and unsafe to drive.   4. Activity: Ankle pumps several times an hour while awake to prevent blood clots. Weight bearing: as tolerated. Use crutches for as needed (usually ~1 week or less) until pain allows you to ambulate without a limp. Bending and straightening the knee is unlimited. Elevate knee above heart level as much as possible for one week. Avoid standing more than 5 minutes (consecutively) for the first week.  Avoid long distance travel for 2 weeks.  5. Medications:  - You have been provided a prescription for narcotic pain medicine. After surgery, take 1-2 narcotic tablets every 4 hours if needed for severe pain.  - You may take up to 3000mg/day of tylenol (acetaminophen). You can take 1000mg 3x/day. Please check your narcotic. If you have acetaminophen in your narcotic (each tablet will be 325mg), be careful not to exceed a total of 3000mg/day of acetaminophen.  - A prescription for anti-nausea medication will be provided in case the narcotic medicine or anesthesia causes nausea - take 1 tablet every 6 hours only if nauseated.  - Take ibuprofen 800 mg every 8 hours WITH food to reduce post-operative knee swelling. DO NOT STOP IBUPROFEN POST-OP UNTIL INSTRUCTED TO DO SO at first post-op office visit (10-14 days after surgery). However, please discontinue if you have any abdominal discomfort after taking this.  - Take enteric coated aspirin 325 mg once daily for 2 weeks to prevent  blood clots.    6. Bandages: The physical therapist should change the bandages at the first post-op appointment. If needed, the dressing supplies have been provided to you.   7. Physical Therapy: 1-2 times per week for 6 weeks. Therapy typically starts on post operative Day 3 or 4. You have been provided an order for physical therapy. The therapist will provide home exercises.   8. Work: May return to full work usually around 2 weeks after 1st post-operative visit. May do light duty/desk job in approximately 1-2 weeks when off of narcotics, pain is well-controlled, and swelling has decreased. Labor intensive jobs may require 4-6 weeks to return.      9. Post-Op Appointments: Your first post-op appointment will be with Dr. Patel in approximately 2 weeks time.    If you find that they have not been scheduled please call the Orthopaedic Appointment front desk at [phone].  AMBULATORY SURGERY  DISCHARGE INSTRUCTIONS   1) The drugs that you were given will stay in your system until tomorrow so for the next 24 hours you should not:  A) Drive an automobile B) Make any legal decisions C) Drink any alcoholic beverage   2) You may resume regular meals tomorrow.  Today it is better to start with liquids and gradually work up to solid foods.  You may eat anything you prefer, but it is better to start with liquids, then soup and crackers, and gradually work up to solid foods.   3) Please notify your   doctor immediately if you have any unusual bleeding, trouble breathing, redness and pain at the surgery site, drainage, fever, or pain not relieved by medication.    4) Additional Instructions:        Please contact your physician with any problems or Same Day Surgery at [phone], Monday through Friday 6 am to 4 pm, or [facility] [place] Main number at [phone].  

## 2020-03-29 ENCOUNTER — Encounter: Payer: Self-pay | Admitting: Orthopedic Surgery

## 2020-05-18 ENCOUNTER — Other Ambulatory Visit: Payer: Self-pay | Admitting: Physician Assistant

## 2020-05-18 DIAGNOSIS — Z1231 Encounter for screening mammogram for malignant neoplasm of breast: Secondary | ICD-10-CM

## 2020-07-05 ENCOUNTER — Inpatient Hospital Stay: Admission: RE | Admit: 2020-07-05 | Payer: BC Managed Care – PPO | Source: Ambulatory Visit

## 2020-07-06 ENCOUNTER — Telehealth: Payer: Self-pay

## 2020-07-06 MED ORDER — LINACLOTIDE 145 MCG PO CAPS
145.0000 ug | ORAL_CAPSULE | Freq: Every day | ORAL | 1 refills | Status: DC
Start: 2020-07-06 — End: 2020-08-26

## 2020-07-06 NOTE — Telephone Encounter (Signed)
Patient made follow up appointment with you on 08/18/2020 for constipation. Patient is wanting a refill to get her through till her appointment of the linzess 145

## 2020-08-03 ENCOUNTER — Telehealth: Payer: Self-pay | Admitting: Gastroenterology

## 2020-08-03 ENCOUNTER — Encounter: Payer: Self-pay | Admitting: *Deleted

## 2020-08-03 ENCOUNTER — Other Ambulatory Visit: Payer: Self-pay

## 2020-08-03 ENCOUNTER — Ambulatory Visit: Payer: BC Managed Care – PPO | Admitting: Gastroenterology

## 2020-08-03 NOTE — Telephone Encounter (Signed)
Tamara Flowers went to [facility] location and it was [employer] location. She lvm but when retrieved it was too late and she was "no showed". She should not be charged. Thank you.

## 2020-08-18 ENCOUNTER — Ambulatory Visit: Payer: BC Managed Care – PPO | Admitting: Gastroenterology

## 2020-08-26 ENCOUNTER — Encounter: Payer: Self-pay | Admitting: Gastroenterology

## 2020-08-26 ENCOUNTER — Ambulatory Visit (INDEPENDENT_AMBULATORY_CARE_PROVIDER_SITE_OTHER): Payer: BC Managed Care – PPO | Admitting: Gastroenterology

## 2020-08-26 ENCOUNTER — Other Ambulatory Visit: Payer: Self-pay

## 2020-08-26 VITALS — BP 117/80 | HR 89 | Temp 98.1°F | Ht 61.0 in | Wt 249.5 lb

## 2020-08-26 DIAGNOSIS — K640 First degree hemorrhoids: Secondary | ICD-10-CM | POA: Diagnosis not present

## 2020-08-26 DIAGNOSIS — K625 Hemorrhage of anus and rectum: Secondary | ICD-10-CM | POA: Diagnosis not present

## 2020-08-26 DIAGNOSIS — K581 Irritable bowel syndrome with constipation: Secondary | ICD-10-CM

## 2020-08-26 MED ORDER — LINACLOTIDE 290 MCG PO CAPS: 290.0000 ug | ORAL_CAPSULE | Freq: Every day | ORAL | 0 refills | Status: DC

## 2020-08-26 NOTE — Progress Notes (Signed)
Arlyss Repress, MD [address]  [address]  [place], [place] [address]  Main: [phone]  Fax: [phone]    Gastroenterology Consultation  Referring Provider:     Tomasa Rand, [place] Primary Care Physician:  Tomasa Rand, [place] Primary Gastroenterologist:  Dr. Arlyss Repress Reason for Consultation: Chronic constipation and rectal bleeding        HPI:   Tamara Flowers is a 50 y.o. female referred by Dr. Julien Girt, Tonny Bollman, PA  for consultation & management of chronic constipation.  Since last 1 year, patient has been experiencing severe constipation as she has been in quarantine several times in COVID-19 pandemic, due to her job and exposure to COVID-19 that has resulted in significant weight gain.  Patient acknowledges consuming red meat regularly, she added some fiber supplements in her diet which does not seem to be sufficient.  She reports hard stools, every 2 to 3 days associated with significant straining, rectal bleeding, swelling.  She also reports epigastric burning pain, reflux for which she takes pantoprazole 40 mg as needed only.  Patient lost about 20 to 30 pounds in the past, regained it within last 1 year  She does not smoke or drink alcohol heavily  Follow-up visit 08/26/2020. Patient is here for follow-up of severe constipation and recurrence of painless rectal bleeding.  She had a meniscal tear that resulted in bedrest for several months, and has gained about 20 pounds.  Patient thinks that her weight gain and sedentary lifestyle has led to worsening of constipation, recurrence of rectal bleeding.  She started going back to work since March 22.  Patient underwent ligation of right anterior lateral hemorrhoids which has resolved her rectal bleeding.  She was previously taking Linzess 145 MCG which was more easily helping with her bowel movements.  She felt that 290 MCG helped her symptoms.  She is requesting if she can get prescription for 290.  She has  bleeding episodes of bright red blood per rectum, dripping into the toilet bowel occurring about every once a week.  She is trying to modify her diet and exercise regularly.  Patient likes to defer hemorrhoid ligation today  NSAIDs: None  Antiplts/Anticoagulants/Anti thrombotics: None She does carry family history of colon cancer in multiple first-degree relatives GI Procedures:   Colonoscopy 05/14/2019- Perianal skin tags found on perianal exam. - Non-bleeding external and internal hemorrhoids. - The entire examined colon is normal. - No specimens collected.  Past Medical History:  Diagnosis Date  . Arthritis    knees, hips, shoulders  . BPPV (benign paroxysmal positional vertigo) 11/2018   1 episode only  . Complication of anesthesia    itching, and slow to wake  . COVID-19 02/05/2019  . Diabetes mellitus without complication (HCC)    Pre-Diabetic before weight loss  . Edema 01/05/2015  . Family history of adverse reaction to anesthesia    Mother and siblings - itching and slow to wake  . Fatigue 01/05/2015  . GERD (gastroesophageal reflux disease) 01/05/2015  . Hemorrhoids   . History of kidney stones   . Hyperlipidemia 01/05/2015  . Hypertension 01/05/2015   NOT CURRENTLY  . Migraine headache    none in approx 2 yrs  . Obesity 01/05/2015  . Pre-diabetes   . Thyroid disease   . Vitamin D deficiency 01/05/2015    Past Surgical History:  Procedure Laterality Date  . CHOLECYSTECTOMY    . COLONOSCOPY WITH PROPOFOL N/A 05/14/2019   Procedure: COLONOSCOPY  WITH PROPOFOL;  Surgeon: Toney Reil, MD;  Location: [facility];  Service: Endoscopy;  Laterality: N/A;  . KNEE ARTHROSCOPY WITH MEDIAL MENISECTOMY Right 03/28/2020   Procedure: Right partial medial meniscectomy;  Surgeon: Signa Kell, MD;  Location: [facility];  Service: Orthopedics;  Laterality: Right;  . NASAL SEPTUM SURGERY  1990s  . NASAL SINUS SURGERY  2017 OR 2018   IN OFFICE  . TONSILLECTOMY    . TUBAL  LIGATION    . VAGINAL HYSTERECTOMY      Current Outpatient Medications:  .  acetaminophen (TYLENOL) 500 MG tablet, Take 2 tablets (1,000 mg total) by mouth every 8 (eight) hours., Disp: 90 tablet, Rfl: 2 .  Ascorbic Acid (VITAMIN C PO), Take 1 capsule by mouth daily., Disp: , Rfl:  .  diclofenac (VOLTAREN) 75 MG EC tablet, Take 75 mg by mouth 2 (two) times daily., Disp: , Rfl:  .  diclofenac Sodium (VOLTAREN) 1 % GEL, Apply 1 application topically 4 (four) times daily as needed (pain)., Disp: , Rfl:  .  fexofenadine (ALLEGRA) 180 MG tablet, Take 180 mg by mouth daily as needed for allergies. , Disp: , Rfl:  .  fluticasone (FLONASE) 50 MCG/ACT nasal spray, Place 1 spray into both nostrils daily as needed for allergies or rhinitis. , Disp: , Rfl:  .  hydrochlorothiazide (HYDRODIURIL) 12.5 MG tablet, Take 12.5 mg by mouth daily as needed (swelling). , Disp: , Rfl:  .  ibuprofen (ADVIL) 800 MG tablet, Take 800 mg by mouth 3 (three) times daily., Disp: , Rfl:  .  linaclotide (LINZESS) 290 MCG CAPS capsule, Take 1 capsule (290 mcg total) by mouth daily before breakfast., Disp: 90 capsule, Rfl: 0 .  Melatonin 10 MG CAPS, Take 10 mg by mouth at bedtime., Disp: , Rfl:  .  Multiple Vitamin (MULTIVITAMIN WITH MINERALS) TABS tablet, Take 1 tablet by mouth daily., Disp: , Rfl:  .  ofloxacin (FLOXIN) 0.3 % OTIC solution, Place 5 drops into both ears daily as needed (irritation). , Disp: , Rfl:  .  Olopatadine HCl (PATADAY OP), Place 1 drop into both eyes daily as needed (allergies). , Disp: , Rfl:  .  Omega-3 Fatty Acids (FISH OIL PO), Take 1 capsule by mouth daily., Disp: , Rfl:  .  pantoprazole (PROTONIX) 40 MG tablet, Take 40 mg by mouth daily as needed (acid reflux). , Disp: , Rfl:  .  VITAMIN D PO, Take 1 capsule by mouth daily., Disp: , Rfl:  .  WEGOVY 0.25 MG/0.5ML SOAJ, SMARTSIG:1 Injection SUB-Q Once a Week, Disp: , Rfl:    Family History  Problem Relation Age of Onset  . Cancer Mother   .  Diabetes Mother   . Hypertension Father   . Heart disease Father   . Diabetes Sister   . Diabetes Brother   . Lupus Sister   . Breast cancer Neg Hx      Social History   Tobacco Use  . Smoking status: Never Smoker  . Smokeless tobacco: Never Used  Vaping Use  . Vaping Use: Never used  Substance Use Topics  . Alcohol use: Yes    Alcohol/week: 0.0 standard drinks    Comment: rare consumption - "Holidays"  . Drug use: No    Allergies as of 08/26/2020 - Review Complete 08/26/2020  Allergen Reaction Noted  . Sulfa antibiotics Nausea And Vomiting 01/05/2015  . Sulfasalazine Nausea And Vomiting 01/05/2015    Review of Systems:    All systems reviewed  and negative except where noted in HPI.   Physical Exam:  BP 117/80 (BP Location: Left Arm, Patient Position: Sitting, Cuff Size: Large)   Pulse 89   Temp 98.1 F (36.7 C) (Oral)   Ht 5\' 1"  (1.549 m)   Wt 249 lb 8 oz (113.2 kg)   BMI 47.14 kg/m  No LMP recorded. Patient has had a hysterectomy.  General:   Alert,  Well-developed, well-nourished, pleasant and cooperative in NAD Head:  Normocephalic and atraumatic. Eyes:  Sclera clear, no icterus.   Conjunctiva pink. Ears:  Normal auditory acuity. Nose:  No deformity, discharge, or lesions. Mouth:  No deformity or lesions,oropharynx pink & moist. Neck:  Supple; no masses or thyromegaly. Lungs:  Respirations even and unlabored.  Clear throughout to auscultation.   No wheezes, crackles, or rhonchi. No acute distress. Heart:  Regular rate and rhythm; no murmurs, clicks, rubs, or gallops. Abdomen:  Normal bowel sounds. Soft, obese, non-tender and non-distended without masses, hepatosplenomegaly or hernias noted.  No guarding or rebound tenderness.   Rectal: Not performed Msk:  Symmetrical without gross deformities. Good, equal movement & strength bilaterally. Pulses:  Normal pulses noted. Extremities:  No clubbing or edema.  No cyanosis. Neurologic:  Alert and oriented x3;   grossly normal neurologically. Skin:  Intact without significant lesions or rashes. No jaundice. Psych:  Alert and cooperative. Normal mood and affect.  Imaging Studies: Reviewed  Assessment and Plan:   Tamara Flowers is a 50 y.o. female with morbid obesity, BMI 46 history of hypertension is seen in consultation for IBS constipation, chronic GERD and symptomatic grade 1 hemorrhoids  IBS constipation, with left lower quadrant pain. Continue high-fiber diet, fiber supplements Adequate intake of water Cut back on red meat intake Trial of Linzess 290 MCG daily  Grade 1 hemorrhoids Recent colonoscopy revealed large internal and external hemorrhoids S/p hemorrhoid ligation x 3  Manage constipation as above before repeating hemorrhoid ligation   Follow up in 3 months   44, MD

## 2020-09-09 ENCOUNTER — Other Ambulatory Visit: Payer: Self-pay | Admitting: Gastroenterology

## 2020-11-01 ENCOUNTER — Ambulatory Visit
Admission: RE | Admit: 2020-11-01 | Discharge: 2020-11-01 | Disposition: A | Discharge status: Home / Self Care | Payer: BC Managed Care – PPO | Source: Ambulatory Visit | Attending: Physician Assistant | Admitting: Physician Assistant

## 2020-11-01 ENCOUNTER — Other Ambulatory Visit: Payer: Self-pay

## 2020-11-01 DIAGNOSIS — Z1231 Encounter for screening mammogram for malignant neoplasm of breast: Secondary | ICD-10-CM | POA: Insufficient documentation

## 2020-11-29 ENCOUNTER — Other Ambulatory Visit: Payer: Self-pay | Admitting: Gastroenterology

## 2021-05-07 ENCOUNTER — Other Ambulatory Visit: Payer: Self-pay | Admitting: Gastroenterology

## 2021-06-13 IMAGING — CR LEFT KNEE - COMPLETE 4+ VIEW
4 series · 4 of 4 positions shown · non-contrast
Comparison: None.

CLINICAL DATA: Injury to anterolateral knee

EXAM:
LEFT KNEE - COMPLETE 4+ VIEW

[knee ap]
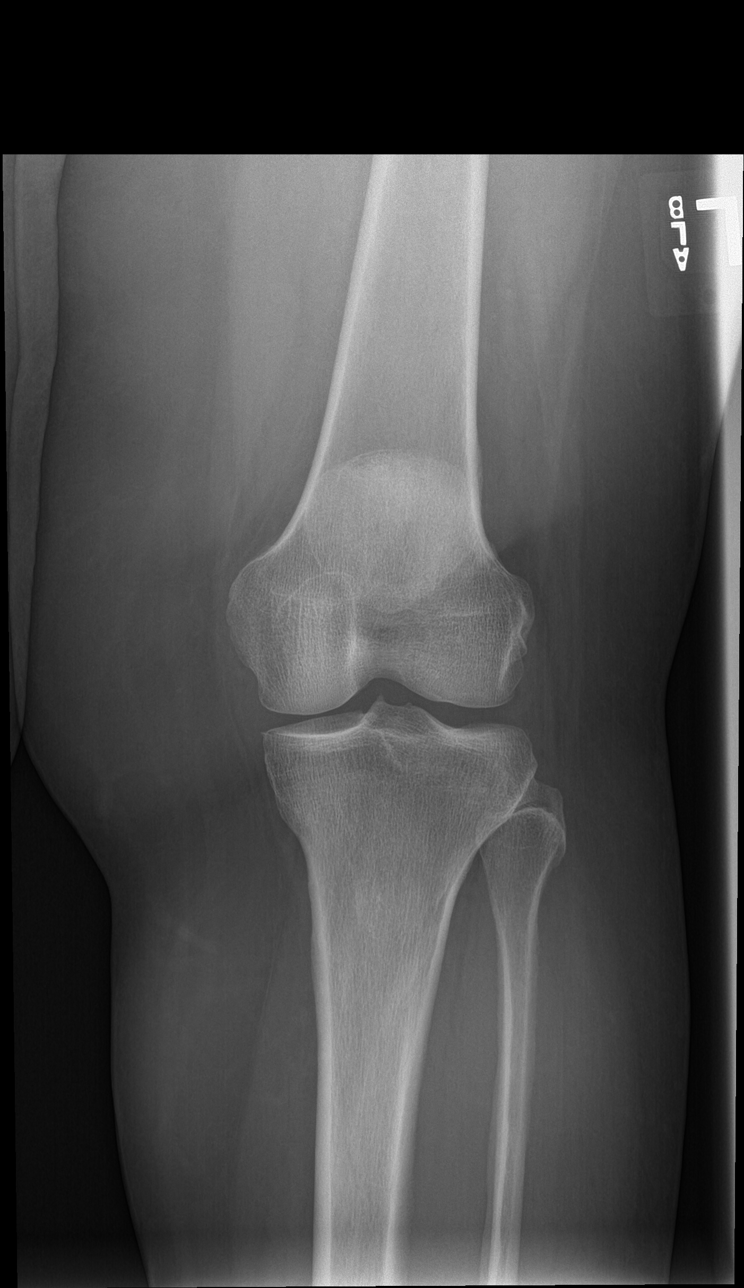

[knee lat]
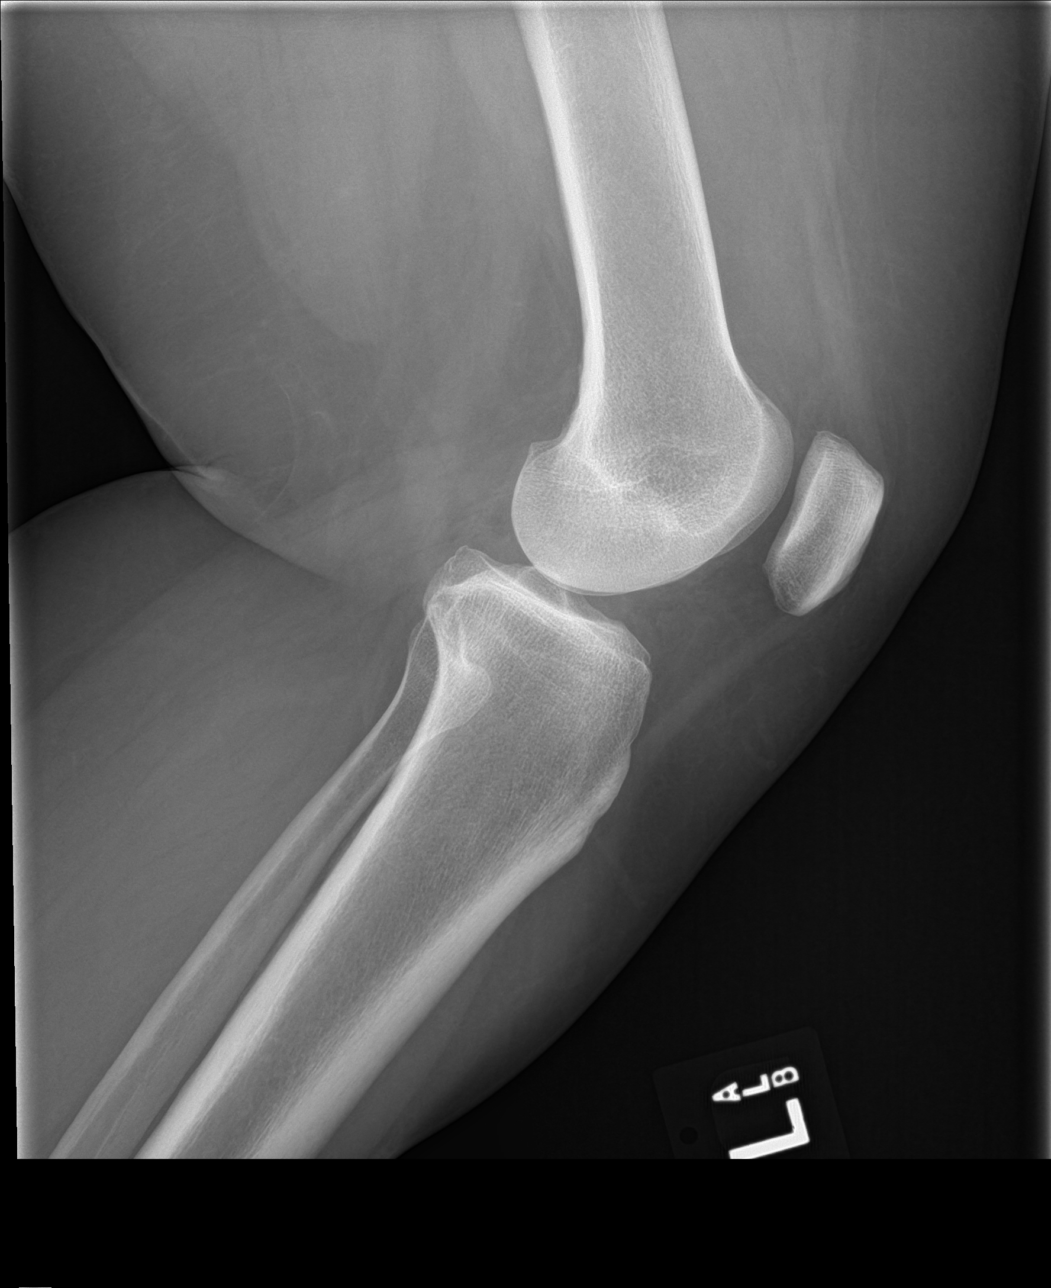

[tunnel]
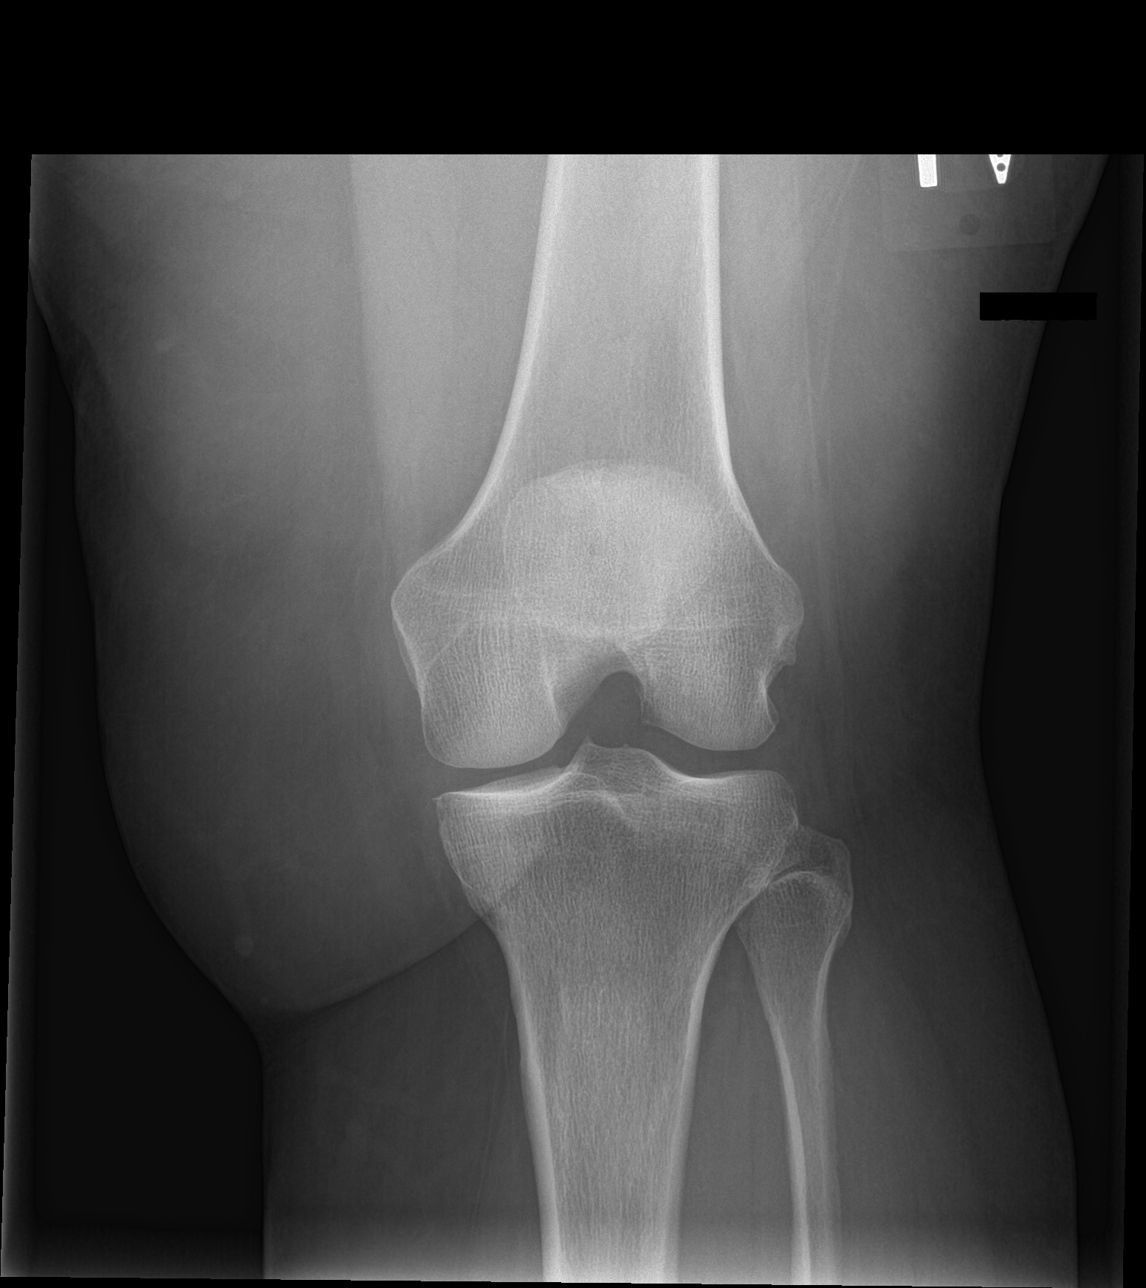

[patella skyline]
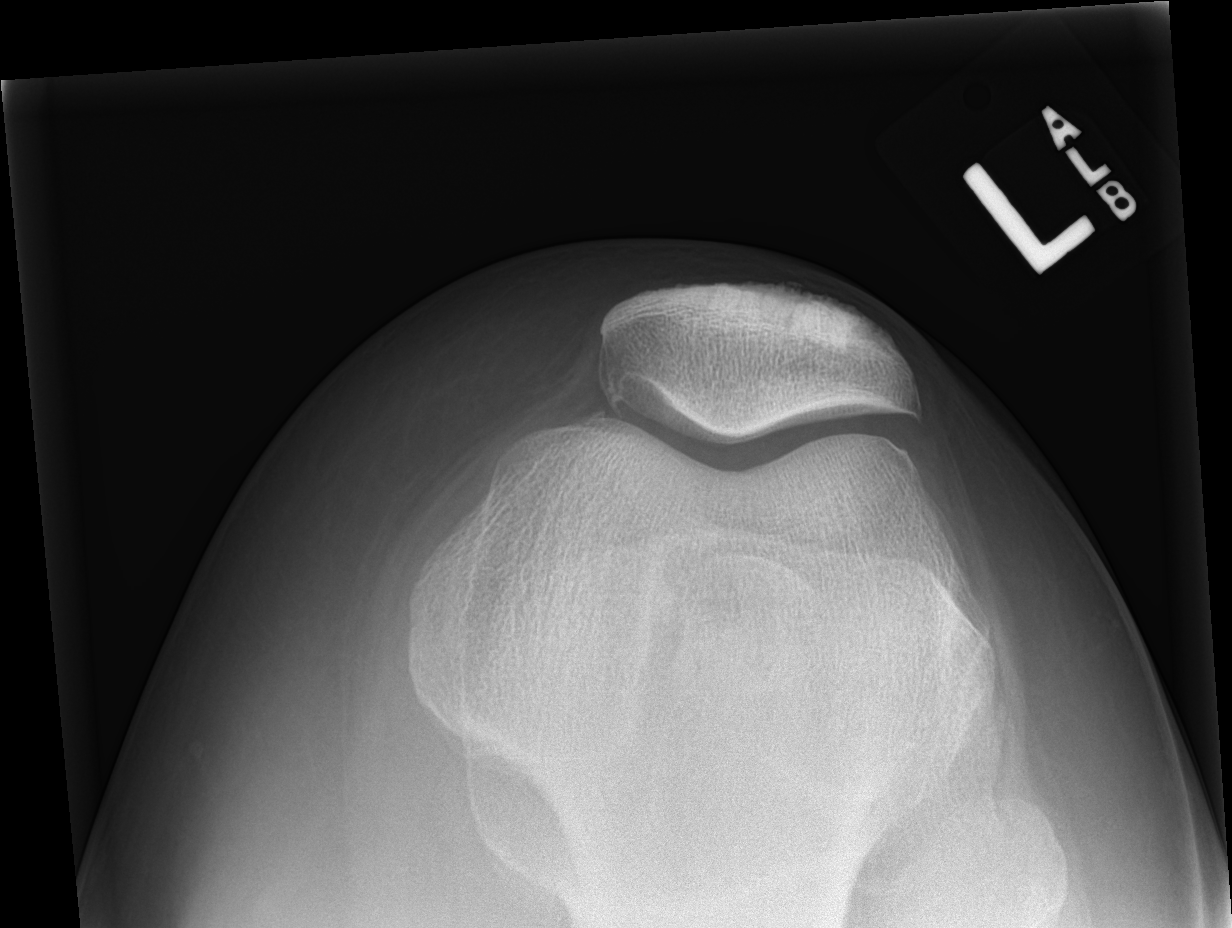

[4 of 4 positions shown; findings below may reference images not displayed]

FINDINGS: No fracture or dislocation of the left knee. Joint spaces are well
preserved. No knee joint effusion.
IMPRESSION: No fracture or dislocation of the left knee. Joint spaces are well
preserved. No knee joint effusion.

## 2021-06-14 ENCOUNTER — Ambulatory Visit
Admission: EM | Admit: 2021-06-14 | Discharge: 2021-06-14 | Disposition: A | Discharge status: Home / Self Care | Payer: BC Managed Care – PPO | Attending: Internal Medicine | Admitting: Internal Medicine

## 2021-06-14 ENCOUNTER — Other Ambulatory Visit: Payer: Self-pay

## 2021-06-14 DIAGNOSIS — B9789 Other viral agents as the cause of diseases classified elsewhere: Secondary | ICD-10-CM

## 2021-06-14 DIAGNOSIS — J019 Acute sinusitis, unspecified: Secondary | ICD-10-CM

## 2021-06-14 MED ORDER — SALINE SPRAY 0.65 % NA SOLN: 1.0000 | NASAL | 0 refills | Status: DC | PRN

## 2021-06-14 MED ORDER — IPRATROPIUM BROMIDE 0.03 % NA SOLN: 2.0000 | Freq: Two times a day (BID) | NASAL | 12 refills | Status: DC

## 2021-06-14 MED ORDER — GUAIFENESIN ER 600 MG PO TB12: 600.0000 mg | ORAL_TABLET | Freq: Two times a day (BID) | ORAL | 0 refills | Status: AC

## 2021-06-14 NOTE — Discharge Instructions (Addendum)
Please continue humidifier use Vapor rub will help with nasal congestion Use medications as prescribed If your symptoms persist more than 10 days please return to urgent care to be reevaluated. No indication for antibiotics at this time.

## 2021-06-14 NOTE — ED Triage Notes (Signed)
Pt reports having several episodes of emesis and sinus pressure since last week. Also reports having mucus drainage.  2 neg home covid test since Saturday.

## 2021-06-14 NOTE — ED Provider Notes (Signed)
[facility]    CSN: [id number] Arrival date & time: 06/14/21  1511      History   Chief Complaint Chief Complaint  Patient presents with   Emesis    HPI Tamara Flowers is a 51 y.o. female with a history of seasonal allergic rhinosinusitis comes to urgent care with several episodes of emesis followed by sinus pressure.  Patient's symptoms started last Friday with several episodes of nonbloody nonbilious emesis.  Emesis has resolved and patient is currently complaining of nasal congestion and sinus pressure.  No fever or chills.  Patient endorses some itchy nose and eyes.  Nasal congestion is mainly right-sided.  No nausea or vomiting at this time.  Patient is fully vaccinated against COVID-19 virus.  She endorses exposure to coworkers with similar symptoms.  HPI  Past Medical History:  Diagnosis Date   Arthritis    knees, hips, shoulders   BPPV (benign paroxysmal positional vertigo) 11/2018   1 episode only   Complication of anesthesia    itching, and slow to wake   COVID-19 02/05/2019   Diabetes mellitus without complication (HCC)    Pre-Diabetic before weight loss   Edema 01/05/2015   Family history of adverse reaction to anesthesia    Mother and siblings - itching and slow to wake   Fatigue 01/05/2015   GERD (gastroesophageal reflux disease) 01/05/2015   Hemorrhoids    History of kidney stones    Hyperlipidemia 01/05/2015   Hypertension 01/05/2015   NOT CURRENTLY   Migraine headache    none in approx 2 yrs   Obesity 01/05/2015   Pre-diabetes    Thyroid disease    Vitamin D deficiency 01/05/2015    Patient Active Problem List   Diagnosis Date Noted   Family history of colon cancer in mother    DDD (degenerative disc disease), lumbar 12/05/2016   Greater trochanteric bursitis, right 12/05/2016   Right knee pain 12/05/2016   Hemorrhoids 07/13/2016   Arthralgia of multiple joints 02/08/2016   Chronic hip pain, right 02/08/2016   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   Morbid obesity with BMI of 45.0-49.9, adult (HCC) 02/08/2016   Hyperglycemia 01/05/2015   Hyperlipidemia 01/05/2015   Hypertension 01/05/2015   Vitamin D deficiency 01/05/2015   GERD (gastroesophageal reflux disease) 01/05/2015   Obesity 01/05/2015   Edema 01/05/2015    Past Surgical History:  Procedure Laterality Date   CHOLECYSTECTOMY     COLONOSCOPY WITH PROPOFOL N/A 05/14/2019   Procedure: COLONOSCOPY WITH PROPOFOL;  Surgeon: Toney Reil, MD;  Location: [facility];  Service: Endoscopy;  Laterality: N/A;   KNEE ARTHROSCOPY WITH MEDIAL MENISECTOMY Right 03/28/2020   Procedure: Right partial medial meniscectomy;  Surgeon: Signa Kell, MD;  Location: [facility];  Service: Orthopedics;  Laterality: Right;   NASAL SEPTUM SURGERY  1990s   NASAL SINUS SURGERY  2017 OR 2018   IN OFFICE   TONSILLECTOMY     TUBAL LIGATION     VAGINAL HYSTERECTOMY      OB History   No obstetric history on file.      Home Medications    Prior to Admission medications   Medication Sig Start Date End Date Taking? Authorizing Provider  guaiFENesin (MUCINEX) 600 MG 12 hr tablet Take 1 tablet (600 mg total) by mouth 2 (two) times daily for 10 days. 06/14/21 06/24/21 Yes Abass Misener, Britta Mccreedy, MD  ipratropium (ATROVENT) 0.03 % nasal spray Place 2 sprays into both nostrils  every 12 (twelve) hours. 06/14/21  Yes Diora Bellizzi, Britta Mccreedy, MD  sodium chloride (OCEAN) 0.65 % SOLN nasal spray Place 1 spray into both nostrils as needed for congestion. 06/14/21  Yes Laurissa Cowper, Britta Mccreedy, MD  Ascorbic Acid (VITAMIN C PO) Take 1 capsule by mouth daily.    [provider]  diclofenac (VOLTAREN) 75 MG EC tablet Take 75 mg by mouth 2 (two) times daily. 07/06/20   [provider]  diclofenac Sodium (VOLTAREN) 1 % GEL Apply 1 application topically 4 (four) times daily as needed (pain).    [provider]  fexofenadine (ALLEGRA) 180 MG tablet Take 180 mg by  mouth daily as needed for allergies.  01/28/14   [provider]  fluticasone (FLONASE) 50 MCG/ACT nasal spray Place 1 spray into both nostrils daily as needed for allergies or rhinitis.     [provider]  hydrochlorothiazide (HYDRODIURIL) 12.5 MG tablet Take 12.5 mg by mouth daily as needed (swelling).  05/26/19   [provider]  ibuprofen (ADVIL) 800 MG tablet Take 800 mg by mouth 3 (three) times daily. 07/21/20   [provider]  linaclotide Karlene Einstein) 290 MCG CAPS capsule TAKE 1 CAPSULE(290 MCG) BY MOUTH DAILY BEFORE BREAKFAST 11/29/20   Vanga, Loel Dubonnet, MD  Melatonin 10 MG CAPS Take 10 mg by mouth at bedtime.    [provider]  Multiple Vitamin (MULTIVITAMIN WITH MINERALS) TABS tablet Take 1 tablet by mouth daily.    [provider]  ofloxacin (FLOXIN) 0.3 % OTIC solution Place 5 drops into both ears daily as needed (irritation).     [provider]  Olopatadine HCl (PATADAY OP) Place 1 drop into both eyes daily as needed (allergies).     [provider]  Omega-3 Fatty Acids (FISH OIL PO) Take 1 capsule by mouth daily.    [provider]  pantoprazole (PROTONIX) 40 MG tablet Take 40 mg by mouth daily as needed (acid reflux).  05/13/19   [provider]  VITAMIN D PO Take 1 capsule by mouth daily.    [provider]    Family History Family History  Problem Relation Age of Onset   Cancer Mother    Diabetes Mother    Hypertension Father    Heart disease Father    Diabetes Sister    Diabetes Brother    Lupus Sister    Breast cancer Neg Hx     Social History Social History   Tobacco Use   Smoking status: Never   Smokeless tobacco: Never  Vaping Use   Vaping Use: Never used  Substance Use Topics   Alcohol use: Yes    Alcohol/week: 0.0 standard drinks    Comment: rare consumption - "Holidays"   Drug use: No     Allergies   Sulfa antibiotics and Sulfasalazine   Review of  Systems Review of Systems  HENT:  Positive for congestion, sinus pressure and sinus pain. Negative for sore throat.   Respiratory:  Positive for cough. Negative for shortness of breath and wheezing.   Neurological: Negative.     Physical Exam Triage Vital Signs ED Triage Vitals [06/14/21 1522]  Enc Vitals Group     BP 119/86     Pulse Rate 98     Resp 18     Temp 98.4 F (36.9 C)     Temp Source Oral     SpO2 99 %     Weight 224 lb (101.6 kg)  Height 5' (1.524 m)     Head Circumference      Peak Flow      Pain Score 5     Pain Loc      Pain Edu?      Excl. in GC?    No data found.  Updated Vital Signs BP 119/86    Pulse 98    Temp 98.4 F (36.9 C) (Oral)    Resp 18    Ht 5' (1.524 m)    Wt 101.6 kg    SpO2 99%    BMI 43.75 kg/m   Visual Acuity Right Eye Distance:   Left Eye Distance:   Bilateral Distance:    Right Eye Near:   Left Eye Near:    Bilateral Near:     Physical Exam Vitals and nursing note reviewed.  Constitutional:      General: She is not in acute distress.    Appearance: She is not ill-appearing.  HENT:     Right Ear: Tympanic membrane normal.     Left Ear: Tympanic membrane normal.     Nose: Nose normal.     Mouth/Throat:     Pharynx: Posterior oropharyngeal erythema present.  Cardiovascular:     Rate and Rhythm: Normal rate and regular rhythm.     Pulses: Normal pulses.     Heart sounds: Normal heart sounds.  Abdominal:     General: Abdomen is flat. Bowel sounds are normal.  Neurological:     Mental Status: She is alert.     UC Treatments / Results  Labs (all labs ordered are listed, but only abnormal results are displayed) Labs Reviewed - No data to display  EKG   Radiology No results found.  Procedures Procedures (including critical care time)  Medications Ordered in UC Medications - No data to display  Initial Impression / Assessment and Plan / UC Course  I have reviewed the triage vital signs and the nursing  notes.  Pertinent labs & imaging results that were available during my care of the patient were reviewed by me and considered in my medical decision making (see chart for details).     1.  Acute viral sinusitis: Humidifier/nasal saline drops/vapor use recommended Continue Allegra and Flonase use Ipratropium nasal spray Saline nasal drops as needed for nasal congestion Mucinex as needed for sputum production Return to urgent care if symptoms persist beyond 10 days-you may need antibiotics at that time Currently there is no indication for antibiotic usage. Final Clinical Impressions(s) / UC Diagnoses   Final diagnoses:  Acute viral sinusitis     Discharge Instructions      Please continue humidifier use Vapor rub will help with nasal congestion Use medications as prescribed If your symptoms persist more than 10 days please return to urgent care to be reevaluated. No indication for antibiotics at this time.   ED Prescriptions     Medication Sig Dispense Auth. Provider   ipratropium (ATROVENT) 0.03 % nasal spray Place 2 sprays into both nostrils every 12 (twelve) hours. 30 mL Raia Amico, Britta Mccreedy, MD   guaiFENesin (MUCINEX) 600 MG 12 hr tablet Take 1 tablet (600 mg total) by mouth 2 (two) times daily for 10 days. 20 tablet Florentino Laabs, Britta Mccreedy, MD   sodium chloride (OCEAN) 0.65 % SOLN nasal spray Place 1 spray into both nostrils as needed for congestion. -- Merrilee Jansky, MD      PDMP not reviewed this encounter.   Any Mcneice, Britta Mccreedy, MD  06/14/21 1615 ° °

## 2021-08-23 DIAGNOSIS — Z860100 Personal history of colon polyps, unspecified: Secondary | ICD-10-CM | POA: Insufficient documentation

## 2022-01-29 ENCOUNTER — Ambulatory Visit (INDEPENDENT_AMBULATORY_CARE_PROVIDER_SITE_OTHER): Payer: BC Managed Care – PPO

## 2022-01-29 ENCOUNTER — Ambulatory Visit
Admission: EM | Admit: 2022-01-29 | Discharge: 2022-01-29 | Disposition: A | Discharge status: Home / Self Care | Payer: BC Managed Care – PPO | Attending: Internal Medicine | Admitting: Internal Medicine

## 2022-01-29 DIAGNOSIS — R059 Cough, unspecified: Secondary | ICD-10-CM | POA: Diagnosis not present

## 2022-01-29 DIAGNOSIS — J209 Acute bronchitis, unspecified: Secondary | ICD-10-CM | POA: Diagnosis not present

## 2022-01-29 MED ORDER — GUAIFENESIN-CODEINE 100-10 MG/5ML PO SOLN
5.0000 mL | Freq: Four times a day (QID) | ORAL | 0 refills | Status: DC | PRN
Start: 2022-01-29 — End: 2022-01-29

## 2022-01-29 MED ORDER — AZITHROMYCIN 250 MG PO TABS: ORAL_TABLET | ORAL | 0 refills | Status: DC

## 2022-01-29 MED ORDER — GUAIFENESIN-CODEINE 100-10 MG/5ML PO SOLN: 5.0000 mL | Freq: Four times a day (QID) | ORAL | 0 refills | Status: DC | PRN

## 2022-01-29 MED ORDER — ALBUTEROL SULFATE HFA 108 (90 BASE) MCG/ACT IN AERS: 2.0000 | INHALATION_SPRAY | RESPIRATORY_TRACT | 0 refills | Status: DC | PRN

## 2022-01-29 NOTE — Discharge Instructions (Signed)
Come back if you get worse in the next 3 days or develop a fever

## 2022-01-29 NOTE — ED Triage Notes (Signed)
Pt c/o cough, wheezing, congestion, abdominal pain from coughing x1week  Pt took a home covid test and it was negative  Pt is worried about pneumonia.

## 2022-01-29 NOTE — ED Provider Notes (Signed)
[facility]    CSN: [id number] Arrival date & time: 01/29/22  1300      History   Chief Complaint No chief complaint on file.   HPI Tamara Flowers is a 51 y.o. female who presents with onset of cough 1 week ago, and is getting worse. Has been wheezing at night. She cant sleep due to the cough. She is concerned of having pneumonia. Her cough is productive with yellow mucous. She denies fever, but has been fatigued. Had a negative home covid test yesterday. Her stomach has aches from so much coughing.     Past Medical History:  Diagnosis Date   Arthritis    knees, hips, shoulders   BPPV (benign paroxysmal positional vertigo) 11/2018   1 episode only   Complication of anesthesia    itching, and slow to wake   COVID-19 02/05/2019   Diabetes mellitus without complication ([place])    Pre-Diabetic before weight loss   Edema 01/05/2015   Family history of adverse reaction to anesthesia    Mother and siblings - itching and slow to wake   Fatigue 01/05/2015   GERD (gastroesophageal reflux disease) 01/05/2015   Hemorrhoids    History of kidney stones    Hyperlipidemia 01/05/2015   Hypertension 01/05/2015   NOT CURRENTLY   Migraine headache    none in approx 2 yrs   Obesity 01/05/2015   Pre-diabetes    Thyroid disease    Vitamin D deficiency 01/05/2015    Patient Active Problem List   Diagnosis Date Noted   Family history of colon cancer in mother    DDD (degenerative disc disease), lumbar 12/05/2016   Greater trochanteric bursitis, right 12/05/2016   Right knee pain 12/05/2016   Hemorrhoids 07/13/2016   Arthralgia of multiple joints 02/08/2016   Chronic hip pain, right 02/08/2016   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   Morbid obesity with BMI of 45.0-49.9, adult ([place]) 02/08/2016   Hyperglycemia 01/05/2015   Hyperlipidemia 01/05/2015   Hypertension 01/05/2015   Vitamin D deficiency 01/05/2015   GERD (gastroesophageal reflux disease)  01/05/2015   Obesity 01/05/2015   Edema 01/05/2015    Past Surgical History:  Procedure Laterality Date   CHOLECYSTECTOMY     COLONOSCOPY WITH PROPOFOL N/A 05/14/2019   Procedure: COLONOSCOPY WITH PROPOFOL;  Surgeon: Lin Landsman, MD;  Location: [place];  Service: Endoscopy;  Laterality: N/A;   KNEE ARTHROSCOPY WITH MEDIAL MENISECTOMY Right 03/28/2020   Procedure: Right partial medial meniscectomy;  Surgeon: Leim Fabry, MD;  Location: [facility];  Service: Orthopedics;  Laterality: Right;   NASAL SEPTUM SURGERY  1990s   NASAL SINUS SURGERY  2017 OR 2018   IN OFFICE   TONSILLECTOMY     TUBAL LIGATION     VAGINAL HYSTERECTOMY      OB History   No obstetric history on file.      Home Medications    Prior to Admission medications   Medication Sig Start Date End Date Taking? Authorizing Provider  albuterol (VENTOLIN HFA) 108 (90 Base) MCG/ACT inhaler Inhale 2 puffs into the lungs every 4 (four) hours as needed for wheezing or shortness of breath. 01/29/22  Yes Rodriguez-Southworth, Sunday Spillers, PA-C  Ascorbic Acid (VITAMIN C PO) Take 1 capsule by mouth daily.   Yes [provider]  azithromycin (ZITHROMAX Z-PAK) 250 MG tablet 2 today then one qd x 4 days 01/29/22  Yes Rodriguez-Southworth, Sunday Spillers, PA-C  diclofenac (VOLTAREN) 75 MG EC tablet  Take 75 mg by mouth 2 (two) times daily. 07/06/20  Yes [provider]  diclofenac Sodium (VOLTAREN) 1 % GEL Apply 1 application topically 4 (four) times daily as needed (pain).   Yes [provider]  fexofenadine (ALLEGRA) 180 MG tablet Take 180 mg by mouth daily as needed for allergies.  01/28/14  Yes [provider]  fluticasone (FLONASE) 50 MCG/ACT nasal spray Place 1 spray into both nostrils daily as needed for allergies or rhinitis.    Yes [provider]  hydrochlorothiazide (HYDRODIURIL) 12.5 MG tablet Take 12.5 mg by mouth daily as needed (swelling).  05/26/19  Yes [provider]   ibuprofen (ADVIL) 800 MG tablet Take 800 mg by mouth 3 (three) times daily. 07/21/20  Yes [provider]  ipratropium (ATROVENT) 0.03 % nasal spray Place 2 sprays into both nostrils every 12 (twelve) hours. 06/14/21  Yes Lamptey, Myrene Galas, MD  linaclotide [facility]) 290 MCG CAPS capsule TAKE 1 CAPSULE(290 MCG) BY MOUTH DAILY BEFORE BREAKFAST 11/29/20  Yes Vanga, Tally Due, MD  Melatonin 10 MG CAPS Take 10 mg by mouth at bedtime.   Yes [provider]  Multiple Vitamin (MULTIVITAMIN WITH MINERALS) TABS tablet Take 1 tablet by mouth daily.   Yes [provider]  ofloxacin (FLOXIN) 0.3 % OTIC solution Place 5 drops into both ears daily as needed (irritation).    Yes [provider]  Olopatadine HCl (PATADAY OP) Place 1 drop into both eyes daily as needed (allergies).    Yes [provider]  Omega-3 Fatty Acids (FISH OIL PO) Take 1 capsule by mouth daily.   Yes [provider]  pantoprazole (PROTONIX) 40 MG tablet Take 40 mg by mouth daily as needed (acid reflux).  05/13/19  Yes [provider]  sodium chloride (OCEAN) 0.65 % SOLN nasal spray Place 1 spray into both nostrils as needed for congestion. 06/14/21  Yes Lamptey, Myrene Galas, MD  VITAMIN D PO Take 1 capsule by mouth daily.   Yes [provider]  guaiFENesin-codeine 100-10 MG/5ML syrup Take 5 mLs by mouth every 6 (six) hours as needed for cough. 01/29/22   Rodriguez-Southworth, Sunday Spillers, PA-C    Family History Family History  Problem Relation Age of Onset   Cancer Mother    Diabetes Mother    Hypertension Father    Heart disease Father    Diabetes Sister    Diabetes Brother    Lupus Sister    Breast cancer Neg Hx     Social History Social History   Tobacco Use   Smoking status: Never   Smokeless tobacco: Never  Vaping Use   Vaping Use: Never used  Substance Use Topics   Alcohol use: Yes    Alcohol/week: 0.0 standard drinks of alcohol    Comment: rare  consumption - "Holidays"   Drug use: No     Allergies   Sulfa antibiotics and Sulfasalazine   Review of Systems Review of Systems  Constitutional:  Positive for fatigue. Negative for appetite change, chills, diaphoresis and fever.  HENT:  Negative for congestion, ear discharge, ear pain, postnasal drip, rhinorrhea and sore throat.   Eyes:  Negative for discharge.  Respiratory:  Positive for cough and wheezing.   Musculoskeletal:  Negative for myalgias.  Neurological:  Negative for headaches.     Physical Exam Triage Vital Signs ED Triage Vitals  Enc Vitals Group     BP 01/29/22 1325 109/78     Pulse Rate 01/29/22 1325 100  Resp 01/29/22 1325 16     Temp 01/29/22 1325 98.8 F (37.1 C)     Temp Source 01/29/22 1325 Oral     SpO2 01/29/22 1325 93 %     Weight 01/29/22 1324 240 lb (108.9 kg)     Height 01/29/22 1324 5' (1.524 m)     Head Circumference --      Peak Flow --      Pain Score 01/29/22 1324 5     Pain Loc --      Pain Edu? --      Excl. in [place]? --    No data found.  Updated Vital Signs BP 109/78 (BP Location: Left Arm)   Pulse 100   Temp 98.8 F (37.1 C) (Oral)   Resp 16   Ht 5' (1.524 m)   Wt 240 lb (108.9 kg)   SpO2 93%   BMI 46.87 kg/m   Visual Acuity Right Eye Distance:   Left Eye Distance:   Bilateral Distance:    Right Eye Near:   Left Eye Near:    Bilateral Near:      Physical Exam Constitutional:      General: He is not in acute distress.    Appearance: He is not toxic-appearing.  HENT:     Head: Normocephalic.     Right Ear: Tympanic membrane, ear canal and external ear normal.     Left Ear: Ear canal and external ear normal.     Nose: Nose normal.     Mouth/Throat:     Mouth: Mucous membranes are moist.     Pharynx: Oropharynx is clear.  Eyes:     General: No scleral icterus.    Conjunctiva/sclera: Conjunctivae normal.  Cardiovascular:     Rate and Rhythm: Normal rate and regular rhythm.     Heart sounds: No murmur  heard.   Pulmonary:     Effort: Pulmonary effort is normal. No respiratory distress.     Breath sounds: Wheezing and crackles present on L lung, only mild wheezing during expiration heard on R lung     Musculoskeletal:        General: Normal range of motion.     Cervical back: Neck supple.  Lymphadenopathy:     Cervical: No cervical adenopathy.  Skin:    General: Skin is warm and dry.     Findings: No rash.  Neurological:     Mental Status: He is alert and oriented to person, place, and time.     Gait: Gait normal.  Psychiatric:        Mood and Affect: Mood normal.        Behavior: Behavior normal.        Thought Content: Thought content normal.        Judgment: Judgment normal.    UC Treatments / Results  Labs (all labs ordered are listed, but only abnormal results are displayed) Labs Reviewed - No data to display  EKG   Radiology DG Chest 2 View  Result Date: 01/29/2022 CLINICAL DATA:  Cough, low pulse ox EXAM: CHEST - 2 VIEW COMPARISON:  06/26/2015 FINDINGS: Cardiac and mediastinal contours are within normal limits. No focal pulmonary opacity. No pleural effusion or pneumothorax. No acute osseous abnormality. IMPRESSION: No acute cardiopulmonary process. Electronically Signed   By: Merilyn Baba M.D.   On: 01/29/2022 13:46    Procedures Procedures (including critical care time)  Medications Ordered in UC Medications - No data to display  Initial Impression /  Assessment and Plan / UC Course  I have reviewed the triage vital signs and the nursing notes.  Pertinent  imaging results that were available during my care of the patient were reviewed by me and considered in my medical decision making (see chart for details).   Acute bronchitis  I placed her on Zpack,  Albuterol inhaler and Robitusin AC as noted   Final Clinical Impressions(s) / UC Diagnoses   Final diagnoses:  Acute bronchitis, unspecified organism     Discharge Instructions      Come back if  you get worse in the next 3 days or develop a fever     ED Prescriptions     Medication Sig Dispense Auth. Provider   azithromycin (ZITHROMAX Z-PAK) 250 MG tablet 2 today then one qd x 4 days 6 tablet Rodriguez-Southworth, Moira Umholtz, PA-C   albuterol (VENTOLIN HFA) 108 (90 Base) MCG/ACT inhaler Inhale 2 puffs into the lungs every 4 (four) hours as needed for wheezing or shortness of breath. 18 g Rodriguez-Southworth, Sharada Albornoz, PA-C   guaiFENesin-codeine 100-10 MG/5ML syrup  (Status: Discontinued) Take 5 mLs by mouth every 6 (six) hours as needed for cough. 120 mL Rodriguez-Southworth, Shaily Librizzi, PA-C   guaiFENesin-codeine 100-10 MG/5ML syrup Take 5 mLs by mouth every 6 (six) hours as needed for cough. 120 mL Rodriguez-Southworth, Sunday Spillers, PA-C      I have reviewed the PDMP during this encounter.   Shelby Mattocks, PA-C 01/29/22 1404

## 2022-01-30 ENCOUNTER — Telehealth: Payer: Self-pay | Admitting: Physician Assistant

## 2022-01-30 MED ORDER — ALBUTEROL SULFATE HFA 108 (90 BASE) MCG/ACT IN AERS: 1.0000 | INHALATION_SPRAY | Freq: Four times a day (QID) | RESPIRATORY_TRACT | 0 refills | Status: DC | PRN

## 2022-01-30 NOTE — Telephone Encounter (Signed)
Recent albuterol inhaler for patient.

## 2022-03-22 ENCOUNTER — Other Ambulatory Visit: Payer: Self-pay | Admitting: Physician Assistant

## 2022-03-22 DIAGNOSIS — Z1231 Encounter for screening mammogram for malignant neoplasm of breast: Secondary | ICD-10-CM

## 2022-03-22 DIAGNOSIS — N951 Menopausal and female climacteric states: Secondary | ICD-10-CM | POA: Insufficient documentation

## 2022-03-26 ENCOUNTER — Ambulatory Visit
Admission: RE | Admit: 2022-03-26 | Discharge: 2022-03-26 | Disposition: A | Discharge status: Home / Self Care | Payer: BC Managed Care – PPO | Source: Ambulatory Visit | Attending: Physician Assistant | Admitting: Physician Assistant

## 2022-03-26 DIAGNOSIS — Z1231 Encounter for screening mammogram for malignant neoplasm of breast: Secondary | ICD-10-CM | POA: Diagnosis present

## 2022-05-29 ENCOUNTER — Other Ambulatory Visit: Payer: Self-pay

## 2022-07-13 DIAGNOSIS — F329 Major depressive disorder, single episode, unspecified: Secondary | ICD-10-CM | POA: Insufficient documentation

## 2022-07-13 DIAGNOSIS — R9431 Abnormal electrocardiogram [ECG] [EKG]: Secondary | ICD-10-CM | POA: Insufficient documentation

## 2022-07-26 DIAGNOSIS — E7800 Pure hypercholesterolemia, unspecified: Secondary | ICD-10-CM | POA: Insufficient documentation

## 2022-08-30 IMAGING — US US EXTREM LOW VENOUS*R*
1 series · 13 of 24 positions shown · non-contrast
Comparison: February 10, 2013

CLINICAL DATA: Right calf region pain

EXAM:
RIGHT LOWER EXTREMITY VENOUS DUPLEX ULTRASOUND
TECHNIQUE: Gray-scale sonography with graded compression, as well as color
Doppler and duplex ultrasound were performed to evaluate the right
lower extremity deep venous system from the level of the common
femoral vein and including the common femoral, femoral, profunda
femoral, popliteal and calf veins including the posterior tibial,
peroneal and gastrocnemius veins when visible. The superficial great
saphenous vein was also interrogated. Spectral Doppler was utilized
to evaluate flow at rest and with distal augmentation maneuvers in
the common femoral, femoral and popliteal veins.

[Series 1: us venous img lower uni right (dvt) · portal-venous · 13 of 33 slices shown]
[im 1/33]
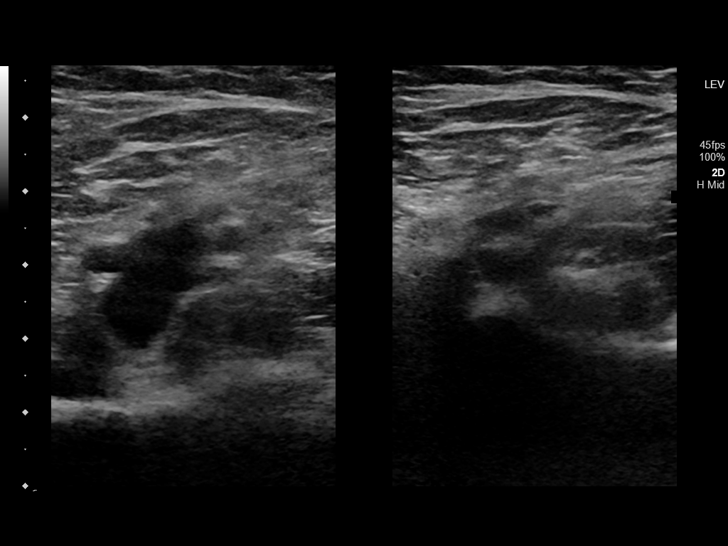
[im 3/33]
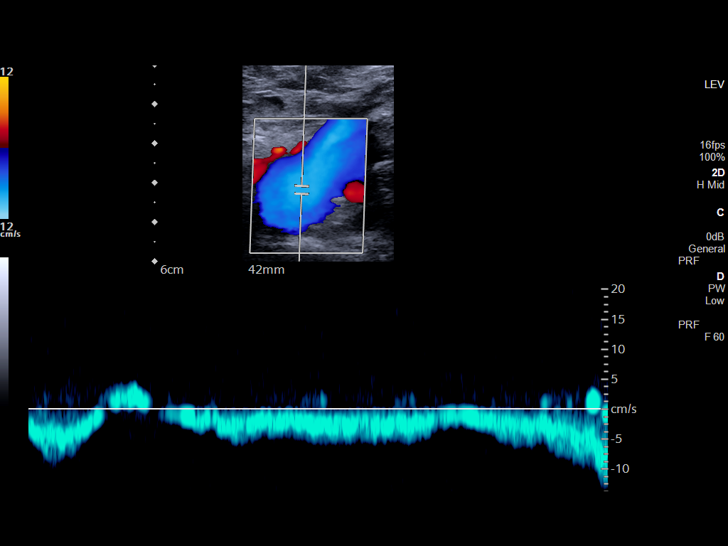
[im 6/33]
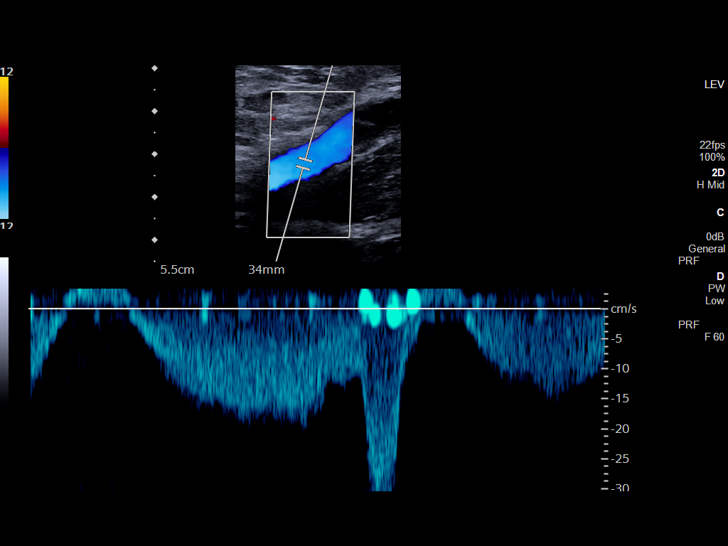
[im 9/33]
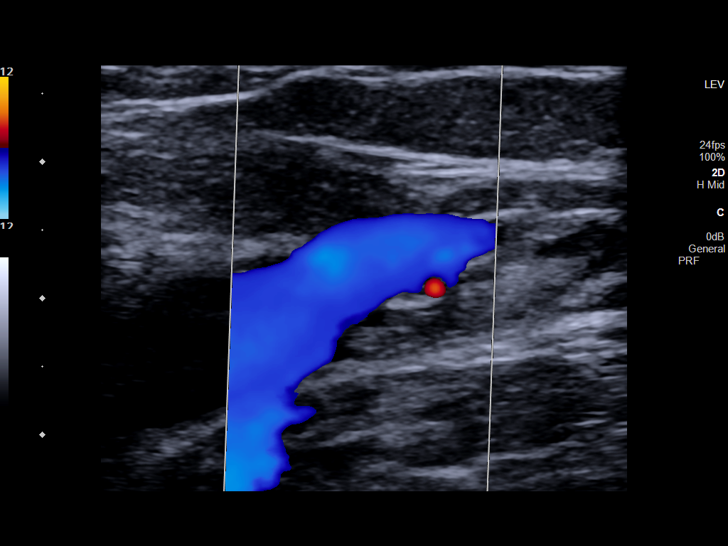
[im 12/33]
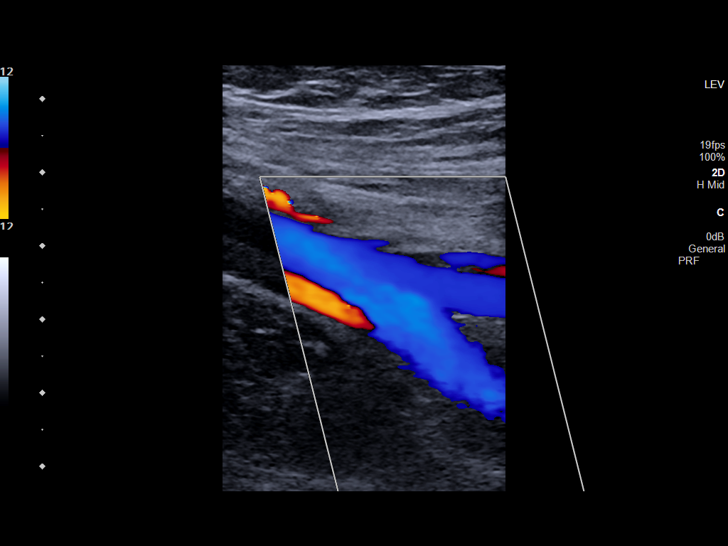
[im 14/33]
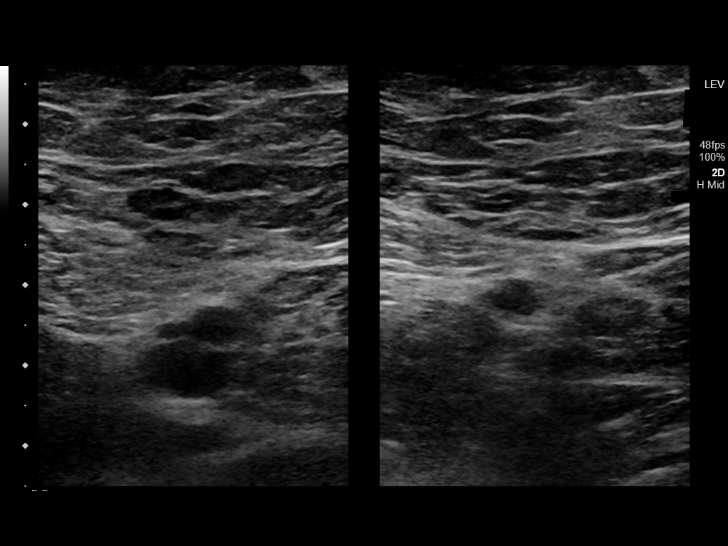
[im 17/33]
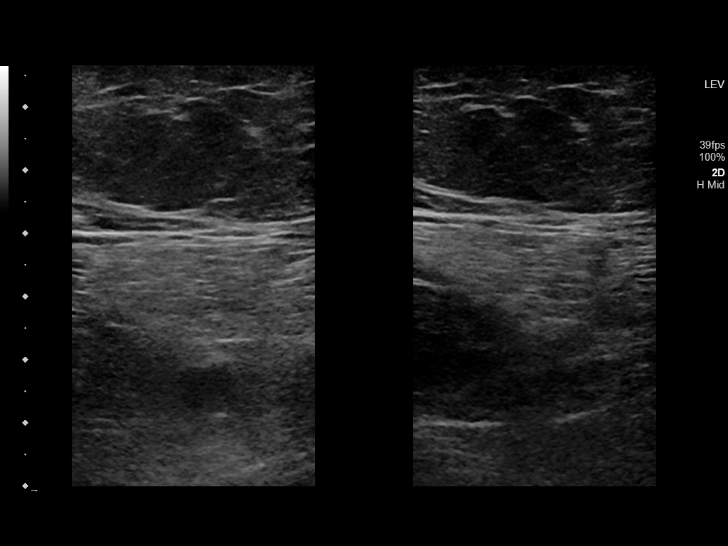
[im 19/33]
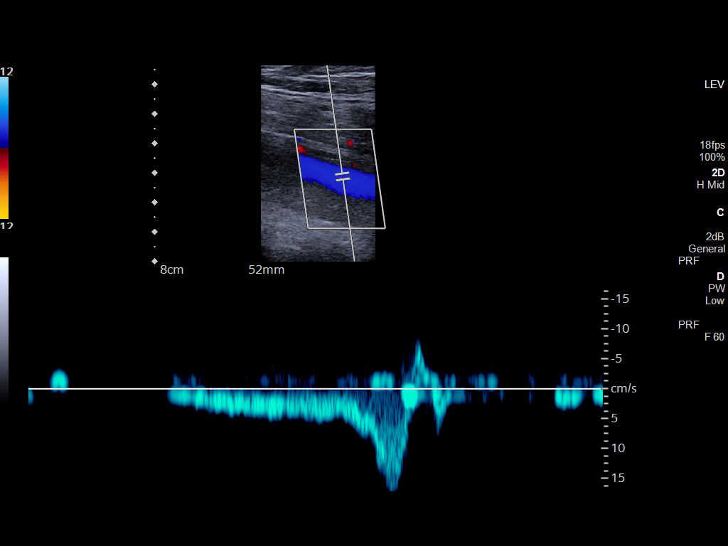
[im 21/33]
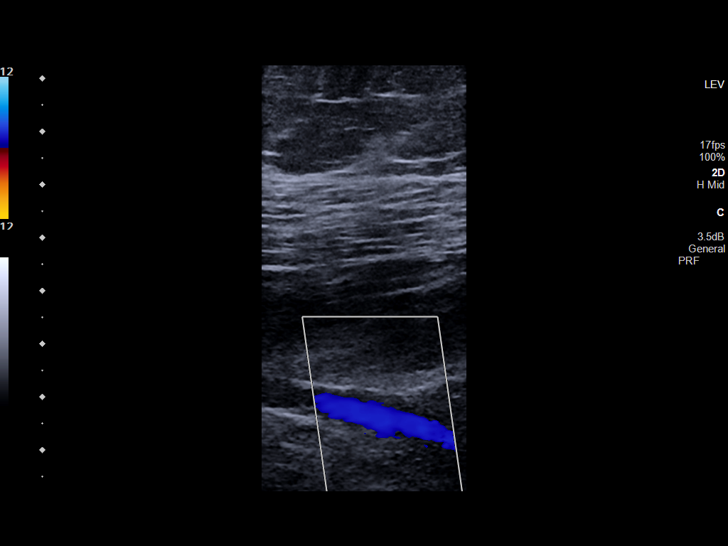
[im 24/33]
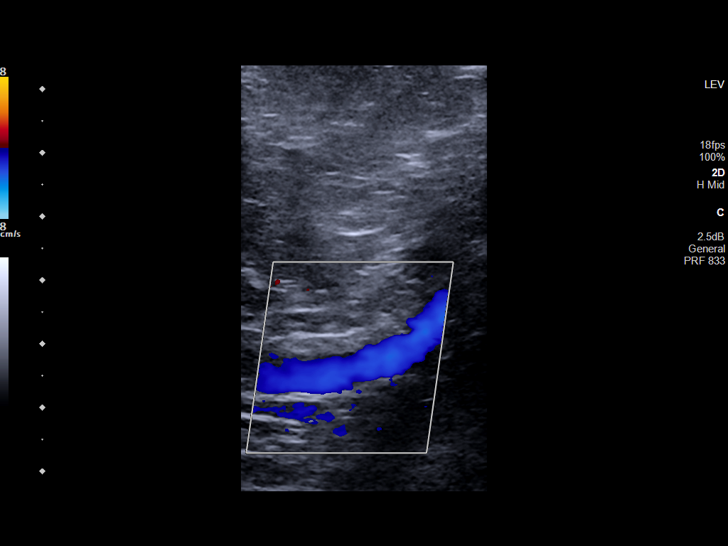
[im 27/33]
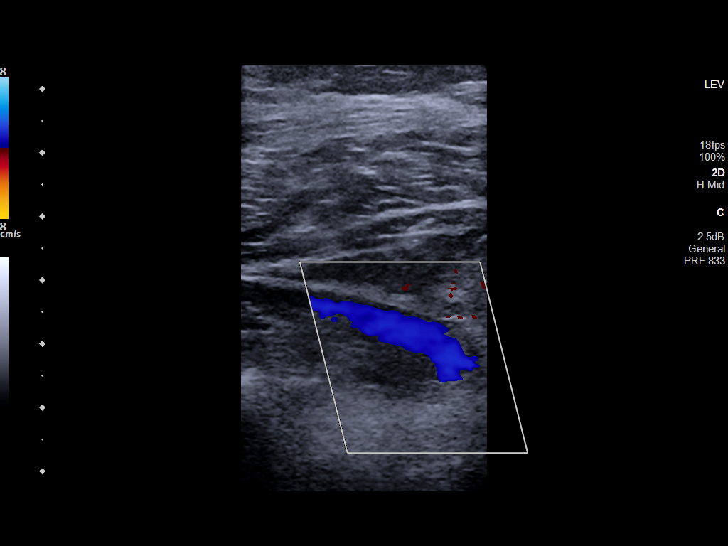
[im 30/33]
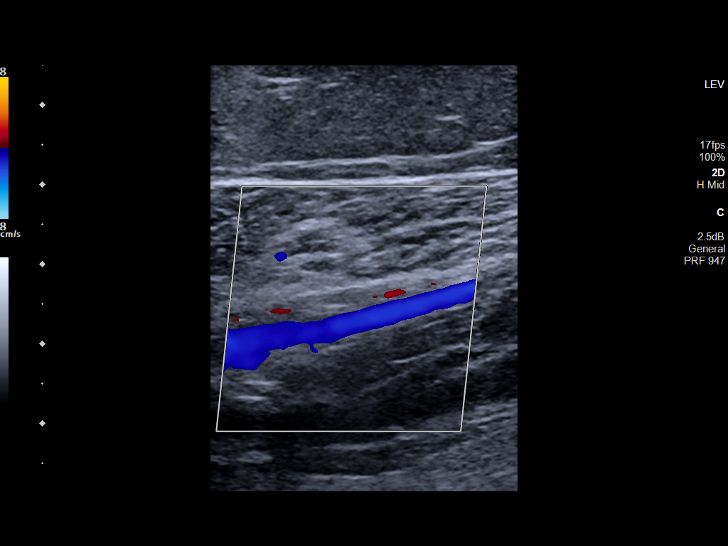
[im 33/33]
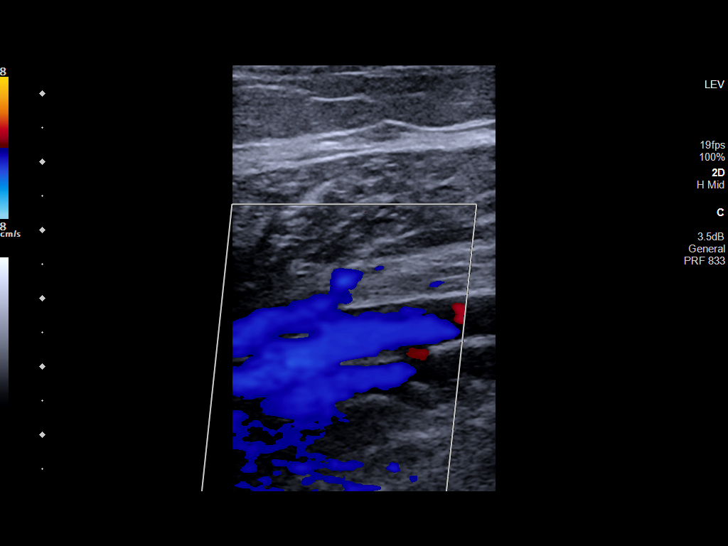

[13 of 24 positions shown; findings below may reference images not displayed]

FINDINGS: Contralateral Common Femoral Vein: Respiratory phasicity is normal
and symmetric with the symptomatic side. No evidence of thrombus.
Normal compressibility.

Common Femoral Vein: No evidence of thrombus. Normal
compressibility, respiratory phasicity and response to augmentation.

Saphenofemoral Junction: No evidence of thrombus. Normal
compressibility and flow on color Doppler imaging.

Profunda Femoral Vein: No evidence of thrombus. Normal
compressibility and flow on color Doppler imaging.

Femoral Vein: No evidence of thrombus. Normal compressibility,
respiratory phasicity and response to augmentation.

Popliteal Vein: No evidence of thrombus. Normal compressibility,
respiratory phasicity and response to augmentation.

Calf Veins: No evidence of thrombus. Normal compressibility and flow
on color Doppler imaging.

Superficial Great Saphenous Vein: No evidence of thrombus. Normal
compressibility.

Venous Reflux:  None.

Other Findings: No demonstrable Baker cyst in the right popliteal
fossa region evident.
IMPRESSION: No evidence of deep venous thrombosis in the right lower extremity.
No demonstrable Baker cyst in the right popliteal fossa. Left common
femoral vein patent.

## 2022-09-07 DIAGNOSIS — F4323 Adjustment disorder with mixed anxiety and depressed mood: Secondary | ICD-10-CM | POA: Insufficient documentation

## 2022-09-07 DIAGNOSIS — R638 Other symptoms and signs concerning food and fluid intake: Secondary | ICD-10-CM | POA: Insufficient documentation

## 2022-09-11 ENCOUNTER — Other Ambulatory Visit: Payer: Self-pay

## 2022-09-11 DIAGNOSIS — M25511 Pain in right shoulder: Secondary | ICD-10-CM

## 2022-09-13 ENCOUNTER — Ambulatory Visit
Admission: RE | Admit: 2022-09-13 | Discharge: 2022-09-13 | Disposition: A | Discharge status: Home / Self Care | Payer: BC Managed Care – PPO | Source: Ambulatory Visit | Attending: Orthopedic Surgery | Admitting: Orthopedic Surgery

## 2022-09-13 DIAGNOSIS — M25511 Pain in right shoulder: Secondary | ICD-10-CM | POA: Insufficient documentation

## 2022-10-01 ENCOUNTER — Encounter: Payer: Self-pay | Admitting: Orthopedic Surgery

## 2022-10-01 ENCOUNTER — Other Ambulatory Visit: Payer: Self-pay | Admitting: Orthopedic Surgery

## 2022-10-05 ENCOUNTER — Ambulatory Visit: Payer: BC Managed Care – PPO | Admitting: Anesthesiology

## 2022-10-05 ENCOUNTER — Ambulatory Visit
Admission: RE | Admit: 2022-10-05 | Discharge: 2022-10-05 | Disposition: A | Discharge status: Home / Self Care | Payer: BC Managed Care – PPO | Attending: Orthopedic Surgery | Admitting: Orthopedic Surgery

## 2022-10-05 ENCOUNTER — Other Ambulatory Visit: Payer: Self-pay

## 2022-10-05 ENCOUNTER — Encounter: Payer: Self-pay | Admitting: Orthopedic Surgery

## 2022-10-05 ENCOUNTER — Encounter
Admission: RE | Disposition: A | Discharge status: Home / Self Care | Payer: Self-pay | Source: Home / Self Care | Attending: Orthopedic Surgery

## 2022-10-05 DIAGNOSIS — I10 Essential (primary) hypertension: Secondary | ICD-10-CM | POA: Diagnosis not present

## 2022-10-05 DIAGNOSIS — K219 Gastro-esophageal reflux disease without esophagitis: Secondary | ICD-10-CM | POA: Diagnosis not present

## 2022-10-05 DIAGNOSIS — M25811 Other specified joint disorders, right shoulder: Secondary | ICD-10-CM | POA: Diagnosis not present

## 2022-10-05 DIAGNOSIS — G709 Myoneural disorder, unspecified: Secondary | ICD-10-CM | POA: Diagnosis not present

## 2022-10-05 DIAGNOSIS — E119 Type 2 diabetes mellitus without complications: Secondary | ICD-10-CM | POA: Diagnosis not present

## 2022-10-05 DIAGNOSIS — M19011 Primary osteoarthritis, right shoulder: Secondary | ICD-10-CM | POA: Insufficient documentation

## 2022-10-05 DIAGNOSIS — M75101 Unspecified rotator cuff tear or rupture of right shoulder, not specified as traumatic: Secondary | ICD-10-CM | POA: Diagnosis not present

## 2022-10-05 HISTORY — DX: Nausea with vomiting, unspecified: Z98.890

## 2022-10-05 HISTORY — DX: Nausea with vomiting, unspecified: R11.2

## 2022-10-05 LAB — GLUCOSE, CAPILLARY: Glucose-Capillary: 101 mg/dL — ABNORMAL HIGH (ref 70–99)

## 2022-10-05 SURGERY — SHOULDER ARTHROSCOPY WITH SUBACROMIAL DECOMPRESSION AND DISTAL CLAVICLE EXCISION
Anesthesia: General | Site: Shoulder | Laterality: Right

## 2022-10-05 MED ORDER — BUPIVACAINE HCL (PF) 0.5 % IJ SOLN
INTRAMUSCULAR | Status: DC | PRN
  Administered 2022-10-05: 20 mL

## 2022-10-05 MED ORDER — ONDANSETRON 4 MG PO TBDP: 4.0000 mg | ORAL_TABLET | Freq: Three times a day (TID) | ORAL | 0 refills | Status: DC | PRN

## 2022-10-05 MED ORDER — PROPOFOL 10 MG/ML IV BOLUS
INTRAVENOUS | Status: DC | PRN
  Administered 2022-10-05: 200 mg via INTRAVENOUS
  Administered 2022-10-05: 50 ug/kg/min via INTRAVENOUS
  Administered 2022-10-05: 50 mg via INTRAVENOUS

## 2022-10-05 MED ORDER — MIDAZOLAM HCL 2 MG/2ML IJ SOLN
2.0000 mg | Freq: Once | INTRAMUSCULAR | Status: AC
  Administered 2022-10-05: 2 mg via INTRAVENOUS

## 2022-10-05 MED ORDER — LIDOCAINE HCL (CARDIAC) PF 100 MG/5ML IV SOSY
PREFILLED_SYRINGE | INTRAVENOUS | Status: DC | PRN
  Administered 2022-10-05: 50 mg via INTRAVENOUS

## 2022-10-05 MED ORDER — DEXAMETHASONE SODIUM PHOSPHATE 10 MG/ML IJ SOLN: INTRAMUSCULAR | Status: DC | PRN

## 2022-10-05 MED ORDER — FENTANYL CITRATE PF 50 MCG/ML IJ SOSY: 50.0000 ug | PREFILLED_SYRINGE | INTRAMUSCULAR | Status: DC | PRN

## 2022-10-05 MED ORDER — OXYCODONE HCL 5 MG PO TABS
10.0000 mg | ORAL_TABLET | Freq: Once | ORAL | Status: AC
  Administered 2022-10-05: 10 mg via ORAL

## 2022-10-05 MED ORDER — CEFAZOLIN SODIUM-DEXTROSE 2-4 GM/100ML-% IV SOLN
2.0000 g | INTRAVENOUS | Status: AC
  Administered 2022-10-05: 2 g via INTRAVENOUS

## 2022-10-05 MED ORDER — DEXAMETHASONE SODIUM PHOSPHATE 4 MG/ML IJ SOLN
INTRAMUSCULAR | Status: DC | PRN
  Administered 2022-10-05: 8 mg via INTRAVENOUS

## 2022-10-05 MED ORDER — LACTATED RINGERS IV SOLN: INTRAVENOUS | Status: DC

## 2022-10-05 MED ORDER — ASPIRIN 325 MG PO TBEC: 325.0000 mg | DELAYED_RELEASE_TABLET | Freq: Every day | ORAL | 0 refills | Status: AC

## 2022-10-05 MED ORDER — LIDOCAINE-EPINEPHRINE 1 %-1:100000 IJ SOLN: INTRAMUSCULAR | Status: DC | PRN

## 2022-10-05 MED ORDER — OXYCODONE HCL 5 MG PO TABS: 5.0000 mg | ORAL_TABLET | ORAL | 0 refills | Status: AC | PRN

## 2022-10-05 MED ORDER — ACETAMINOPHEN 500 MG PO TABS: 1000.0000 mg | ORAL_TABLET | Freq: Three times a day (TID) | ORAL | 2 refills | Status: AC

## 2022-10-05 MED ORDER — FENTANYL CITRATE (PF) 100 MCG/2ML IJ SOLN
100.0000 ug | Freq: Once | INTRAMUSCULAR | Status: AC
  Administered 2022-10-05: 100 ug via INTRAVENOUS

## 2022-10-05 MED ORDER — SCOPOLAMINE 1 MG/3DAYS TD PT72
1.0000 | MEDICATED_PATCH | Freq: Once | TRANSDERMAL | Status: DC
  Administered 2022-10-05: 1.5 mg via TRANSDERMAL

## 2022-10-05 MED ORDER — ONDANSETRON HCL 4 MG/2ML IJ SOLN
4.0000 mg | Freq: Once | INTRAMUSCULAR | Status: AC
  Administered 2022-10-05: 4 mg via INTRAVENOUS

## 2022-10-05 MED ORDER — SUGAMMADEX SODIUM 200 MG/2ML IV SOLN
INTRAVENOUS | Status: DC | PRN
  Administered 2022-10-05: 200 mg via INTRAVENOUS

## 2022-10-05 MED ORDER — EPHEDRINE SULFATE (PRESSORS) 50 MG/ML IJ SOLN
INTRAMUSCULAR | Status: DC | PRN
  Administered 2022-10-05 (×5): 5 mg via INTRAVENOUS

## 2022-10-05 MED ORDER — LACTATED RINGERS IV SOLN
INTRAVENOUS | Status: DC | PRN
  Administered 2022-10-05: 12000 mL

## 2022-10-05 MED ORDER — LACTATED RINGERS IR SOLN
Status: DC | PRN
  Administered 2022-10-05 (×2): 3000 mL
  Administered 2022-10-05 (×2): 6000 mL

## 2022-10-05 MED ORDER — ROCURONIUM BROMIDE 100 MG/10ML IV SOLN
INTRAVENOUS | Status: DC | PRN
  Administered 2022-10-05: 40 mg via INTRAVENOUS

## 2022-10-05 MED ORDER — BUPIVACAINE LIPOSOME 1.3 % IJ SUSP
INTRAMUSCULAR | Status: DC | PRN
  Administered 2022-10-05: 10 mL via PERINEURAL

## 2022-10-05 SURGICAL SUPPLY — 53 items
ADPR IRR PORT MULTIBAG TUBE (MISCELLANEOUS) ×1
ANCH SUT 2 SWLK 19.1 CLS EYLT (Anchor) ×2 IMPLANT
ANCH SUT 2.9 PUSHLOCK ANCH (Orthopedic Implant) ×1 IMPLANT
ANCH SUT 2X2.3 TAPE (Anchor) ×2 IMPLANT
ANCH SUT SHRT 12.5 CANN EYLT (Anchor) ×1 IMPLANT
ANCHOR 2.3 SP SGL 1.2 XBRAID (Anchor) IMPLANT
ANCHOR SUT BIOCOMP LK 2.9X12.5 (Anchor) IMPLANT
ANCHOR SWIVELOCK BIO 4.75X19.1 (Anchor) IMPLANT
APL PRP STRL LF DISP 70% ISPRP (MISCELLANEOUS) ×1
BLADE SHAVER 4.5X7 STR FR (MISCELLANEOUS) ×1 IMPLANT
BUR BR 5.5 WIDE MOUTH (BURR) ×1 IMPLANT
CANNULA PART THRD DISP 5.75X7 (CANNULA) IMPLANT
CANNULA TWIST IN 8.25X7CM (CANNULA) IMPLANT
CHLORAPREP W/TINT 26 (MISCELLANEOUS) ×1 IMPLANT
COOLER POLAR GLACIER W/PUMP (MISCELLANEOUS) ×1 IMPLANT
COVER LIGHT HANDLE UNIVERSAL (MISCELLANEOUS) ×2 IMPLANT
DRAPE INCISE IOBAN 66X45 STRL (DRAPES) ×1 IMPLANT
DRAPE STERI 35X30 U-POUCH (DRAPES) ×1 IMPLANT
DRAPE U-SHAPE 48X52 POLY STRL (PACKS) ×1 IMPLANT
DRSG TEGADERM 4X4.75 (GAUZE/BANDAGES/DRESSINGS) ×3 IMPLANT
ELECT REM PT RETURN 9FT ADLT (ELECTROSURGICAL) ×1
ELECTRODE REM PT RTRN 9FT ADLT (ELECTROSURGICAL) IMPLANT
GAUZE SPONGE 4X4 12PLY STRL (GAUZE/BANDAGES/DRESSINGS) ×1 IMPLANT
GAUZE XEROFORM 1X8 LF (GAUZE/BANDAGES/DRESSINGS) ×1 IMPLANT
GLOVE SRG 8 PF TXTR STRL LF DI (GLOVE) ×3 IMPLANT
GLOVE SURG ENC MOIS LTX SZ7.5 (GLOVE) ×4 IMPLANT
GLOVE SURG UNDER POLY LF SZ8 (GLOVE) ×3
GOWN STRL REIN 2XL XLG LVL4 (GOWN DISPOSABLE) ×1 IMPLANT
GOWN STRL REUS W/ TWL LRG LVL3 (GOWN DISPOSABLE) ×3 IMPLANT
GOWN STRL REUS W/TWL LRG LVL3 (GOWN DISPOSABLE) ×3
IV LACTATED RINGER IRRG 3000ML (IV SOLUTION) ×8
IV LR IRRIG 3000ML ARTHROMATIC (IV SOLUTION) ×4 IMPLANT
KIT STABILIZATION SHOULDER (MISCELLANEOUS) ×1 IMPLANT
KIT TURNOVER KIT A (KITS) ×1 IMPLANT
MANIFOLD NEPTUNE II (INSTRUMENTS) ×1 IMPLANT
MASK FACE SPIDER DISP (MASK) ×1 IMPLANT
MAT GRAY ABSORB FLUID 28X50 (MISCELLANEOUS) ×2 IMPLANT
PACK ARTHROSCOPY SHOULDER (MISCELLANEOUS) ×1 IMPLANT
PAD ABD DERMACEA PRESS 5X9 (GAUZE/BANDAGES/DRESSINGS) ×2 IMPLANT
PAD WRAPON POLAR SHDR XLG (MISCELLANEOUS) ×1 IMPLANT
PASSER SUT FIRSTPASS SELF (INSTRUMENTS) IMPLANT
SET Y ADAPTER MULIT-BAG IRRIG (MISCELLANEOUS) ×1 IMPLANT
SUT ETHILON 3-0 FS-10 30 BLK (SUTURE) ×1
SUT VIC AB 0 CT1 36 (SUTURE) ×1 IMPLANT
SUTURE EHLN 3-0 FS-10 30 BLK (SUTURE) ×1 IMPLANT
SUTURETAPE 1.3 40 W/NDL BLUE (SUTURE) IMPLANT
SYSTEM IMPL TENODESIS LNT 2.9 (Orthopedic Implant) IMPLANT
TAPE MICROFOAM 4IN (TAPE) ×1 IMPLANT
TUBING CONNECTING 10 (TUBING) ×1 IMPLANT
TUBING INFLOW SET DBFLO PUMP (TUBING) ×1 IMPLANT
TUBING OUTFLOW SET DBLFO PUMP (TUBING) ×1 IMPLANT
WAND WEREWOLF FLOW 90D (MISCELLANEOUS) ×1 IMPLANT
WRAPON POLAR PAD SHDR XLG (MISCELLANEOUS) ×1

## 2022-10-05 NOTE — Progress Notes (Signed)
Assisted [place] ANMD with interscalene block. Side rails up, monitors on throughout procedure. See vital signs in flow sheet. Tolerated Procedure well.

## 2022-10-05 NOTE — Discharge Instructions (Signed)
Post-Op Instructions - Rotator Cuff Repair ? ?1. Bracing: You will wear a shoulder immobilizer or sling for 6 weeks.  ? ?2. Driving: No driving for 3 weeks post-op. When driving, do not wear the immobilizer. Ideally, we recommend no driving for 6 weeks while sling is in place as one arm will be immobilized.  ? ?3. Activity: No active lifting for 2 months. Wrist, hand, and elbow motion only. Avoid lifting the upper arm away from the body except for hygiene. You are permitted to bend and straighten the elbow passively only (no active elbow motion). You may use your hand and wrist for typing, writing, and managing utensils (cutting food). Do not lift more than a coffee cup for 8 weeks.  When sleeping or resting, inclined positions (recliner chair or wedge pillow) and a pillow under the forearm for support may provide better comfort for up to 4 weeks.  Avoid long distance travel for 4 weeks. ? ?Return to normal activities after rotator cuff repair repair normally takes 6 months on average. If rehab goes very well, may be able to do most activities at 4 months, except overhead or contact sports. ? ?4. Physical Therapy: Begins 3-4 days after surgery, and proceed 1 time per week for the first 6 weeks, then 1-2 times per week from weeks 6-20 post-op. ? ?5. Medications:  ?- You will be provided a prescription for narcotic pain medicine. After surgery, take 1-2 narcotic tablets every 4 hours if needed for severe pain.  ?- A prescription for anti-nausea medication will be provided in case the narcotic medicine causes nausea - take 1 tablet every 6 hours only if nauseated.   ?- Take tylenol 1000 mg (2 Extra Strength tablets or 3 regular strength) every 8 hours for pain.  May decrease or stop tylenol 5 days after surgery if you are having minimal pain. ?- Take ASA 325mg/day x 2 weeks to help prevent DVTs/PEs (blood clots).  ?- DO NOT take ANY nonsteroidal anti-inflammatory pain medications (Advil, Motrin, Ibuprofen, Aleve,  Naproxen, or Naprosyn). These medicines can inhibit healing of your shoulder repair.  ? ? ?If you are taking prescription medication for anxiety, depression, insomnia, muscle spasm, chronic pain, or for attention deficit disorder, you are advised that you are at a higher risk of adverse effects with use of narcotics post-op, including narcotic addiction/dependence, depressed breathing, death. ?If you use non-prescribed substances: alcohol, marijuana, cocaine, heroin, methamphetamines, etc., you are at a higher risk of adverse effects with use of narcotics post-op, including narcotic addiction/dependence, depressed breathing, death. ?You are advised that taking > 50 morphine milligram equivalents (MME) of narcotic pain medication per day results in twice the risk of overdose or death. For your prescription provided: oxycodone 5 mg - taking more than 6 tablets per day would result in > 50 morphine milligram equivalents (MME) of narcotic pain medication. ?Be advised that we will prescribe narcotics short-term, for acute post-operative pain only - 3 weeks for major operations such as shoulder repair/reconstruction surgeries.  ? ? ? ?6. Post-Op Appointment: ? ?Your first post-op appointment will be 10-14 days post-op. ? ?7. Work or School: For most, but not all procedures, we advise staying out of work or school for at least 1 to 2 weeks in order to recover from the stress of surgery and to allow time for healing.  ? ?If you need a work or school note this can be provided.  ? ?8. Smoking: If you are a smoker, you need to refrain from   smoking in the postoperative period. The nicotine in cigarettes will inhibit healing of your shoulder repair and decrease the chance of successful repair. Similarly, nicotine containing products (gum, patches) should be avoided.  ? ?Post-operative Brace: ?Apply and remove the brace you received as you were instructed to at the time of fitting and as described in detail as the brace?s  instructions for use indicate.  Wear the brace for the period of time prescribed by your physician.  The brace can be cleaned with soap and water and allowed to air dry only.  Should the brace result in increased pain, decreased feeling (numbness/tingling), increased swelling or an overall worsening of your medical condition, please contact your doctor immediately.  If an emergency situation occurs as a result of wearing the brace after normal business hours, please dial 911 and seek immediate medical attention.  Let your doctor know if you have any further questions about the brace issued to you. ?Refer to the shoulder sling instructions for use if you have any questions regarding the correct fit of your shoulder sling.  ?[facility] Customer Care for Troubleshooting: [phone] ? ?Video that illustrates how to properly use a shoulder sling: ?"Instructions for Proper Use of an Orthopaedic Sling" ?https://www.youtube.com/watch?v=AHZpn_Xo45w ? ? ? ? ?

## 2022-10-05 NOTE — Anesthesia Procedure Notes (Addendum)
Procedure Name: Intubation Date/Time: 10/05/2022 9:15 AM  Performed by: Domenic Moras, CRNAPre-anesthesia Checklist: Patient identified, Emergency Drugs available, Suction available and Patient being monitored Patient Re-evaluated:Patient Re-evaluated prior to induction Oxygen Delivery Method: Circle system utilized Preoxygenation: Pre-oxygenation with 100% oxygen Induction Type: IV induction Ventilation: Mask ventilation without difficulty Laryngoscope Size: 3 and McGraph Grade View: Grade I Tube type: Oral Tube size: 7.0 mm Number of attempts: 1 Airway Equipment and Method: Stylet and Oral airway Placement Confirmation: ETT inserted through vocal cords under direct vision, positive ETCO2 and breath sounds checked- equal and bilateral Secured at: 20 cm Tube secured with: Tape Dental Injury: Teeth and Oropharynx as per pre-operative assessment

## 2022-10-05 NOTE — Anesthesia Procedure Notes (Addendum)
Anesthesia Regional Block: Interscalene brachial plexus block   Pre-Anesthetic Checklist: , timeout performed,  Correct Patient, Correct Site, Correct Laterality,  Correct Procedure, Correct Position, site marked,  Risks and benefits discussed,  Surgical consent,  Pre-op evaluation,  At surgeon's request and post-op pain management  Laterality: Left and Right  Prep: chloraprep       Needles:  Injection technique: Single-shot  Needle Type: Echogenic Stimulator Needle      Needle Gauge: 21     Additional Needles:   Procedures:,,,, ultrasound used (permanent image in chart),,   Motor weakness within 5 minutes.   Nerve Stimulator or Paresthesia:  Response: bicep contraction, 0.45 mA  Additional Responses:   Narrative:  Start time: 10/05/2022 10:40 AM End time: 10/05/2022 10:47 AM Injection made incrementally with aspirations every 5 mL.  Performed by: Personally  Anesthesiologist: Tamara Humble, MD  Additional Notes:  This is an accidental entry, but I can't figure out how to delete this note, so am writing disclaimer.

## 2022-10-05 NOTE — Anesthesia Preprocedure Evaluation (Addendum)
Anesthesia Evaluation  Patient identified by MRN, date of birth, ID band Patient awake    Reviewed: Allergy & Precautions, H&P , NPO status , Patient's Chart, lab work & pertinent test results  History of Anesthesia Complications (+) Family history of anesthesia reaction and history of anesthetic complications  Airway Mallampati: IV  TM Distance: >3 FB Neck ROM: Full   Comment: Has good TMD but might consider videolaryngoscope for intubation.  Severe GERD.  Dental no notable dental hx.    Pulmonary neg pulmonary ROS Discussed heavy snoring and daytime drowsiness, discussed risks untreated sleep apnea, urged sleep study. Patient indicated willingness to be tested.    Pulmonary exam normal breath sounds clear to auscultation       Cardiovascular hypertension, Normal cardiovascular exam Rhythm:Regular Rate:Normal     Neuro/Psych  Headaches  Neuromuscular disease negative neurological ROS  negative psych ROS   GI/Hepatic Neg liver ROS,GERD  ,,  Endo/Other  diabetes, Type 2, Oral Hypoglycemic Agents    Renal/GU negative Renal ROS  negative genitourinary   Musculoskeletal negative musculoskeletal ROS (+) Arthritis ,    Abdominal   Peds negative pediatric ROS (+)  Hematology negative hematology ROS (+)   Anesthesia Other Findings GERD (gastroesophageal reflux disease Hyperlipidemia Fatigue  Vitamin D deficiency Obesity  Edema Thyroid disease Arthritis Migraine headache BPPV (benign paroxysmal positional vertigo)  Hemorrhoids Diabetes mellitus without complication (HCC) COVID-19  Pre-diabetes PONV and slow to wake up from anesthesia Hypertension  History of kidney stones    Reproductive/Obstetrics negative OB ROS                              Anesthesia Physical Anesthesia Plan  ASA: 1  Anesthesia Plan: General ETT   Post-op Pain Management:    Induction: Intravenous  PONV  Risk Score and Plan:   Airway Management Planned: Oral ETT  Additional Equipment:   Intra-op Plan:   Post-operative Plan: Extubation in OR  Informed Consent: I have reviewed the patients History and Physical, chart, labs and discussed the procedure including the risks, benefits and alternatives for the proposed anesthesia with the patient or authorized representative who has indicated his/her understanding and acceptance.     Dental Advisory Given  Plan Discussed with: Anesthesiologist, CRNA and Surgeon  Anesthesia Plan Comments: (Patient consented for risks of anesthesia including but not limited to:  - adverse reactions to medications - damage to eyes, teeth, lips or other oral mucosa - nerve damage due to positioning  - sore throat or hoarseness - Damage to heart, brain, nerves, lungs, other parts of body or loss of life  Patient voiced understanding.)         Anesthesia Quick Evaluation

## 2022-10-05 NOTE — H&P (Signed)
Paper H&P to be scanned into permanent record. H&P reviewed. No significant changes noted.  

## 2022-10-05 NOTE — Anesthesia Procedure Notes (Addendum)
Anesthesia Regional Block: Interscalene brachial plexus block   Pre-Anesthetic Checklist: , timeout performed,  Correct Patient, Correct Site, Correct Laterality,  Correct Procedure, Correct Position, site marked,  Risks and benefits discussed,  Surgical consent,  Pre-op evaluation,  At surgeon's request and post-op pain management  Laterality: Right  Prep: chloraprep       Needles:  Injection technique: Single-shot  Needle Type: Stimiplex     Needle Length: 10cm  Needle Gauge: 21     Additional Needles:   Procedures:,,,, ultrasound used (permanent image in chart),,    Narrative:  Start time: 10/05/2022 8:25 AM End time: 10/05/2022 8:34 AM Injection made incrementally with aspirations every 5 mL.  Performed by: Personally  Anesthesiologist: Marisue Humble, MD  Additional Notes: Functioning IV was confirmed and monitors applied. Ultrasound guidance: relevant anatomy identified, needle position confirmed, local anesthetic spread visualized around nerve(s)., vascular puncture avoided.  Image printed for medical record.  Negative aspiration and no paresthesias; incremental administration of local anesthetic. The patient tolerated the procedure well. Vitals signes recorded in RN notes. Also superficial cervical plexus block and intercostobrachial

## 2022-10-05 NOTE — Op Note (Signed)
SURGERY DATE: 10/05/2022   PRE-OP DIAGNOSIS:  1. Right subacromial impingement 2. Right biceps tendinopathy 3. Right rotator cuff tear 4. Right acromioclavicular joint arthritis  POST-OP DIAGNOSIS: 1. Right subacromial impingement 2. Right biceps tendinopathy 3. Right rotator cuff tear 4. Right acromioclavicular joint arthritis  PROCEDURES:  1. Right arthroscopic rotator cuff repair 2. Right arthroscopic biceps tenodesis 3. Right arthroscopic subacromial decompression 4. Right arthroscopic extensive debridement of shoulder (glenohumeral and subacromial spaces) 5. Right arthroscopic distal clavicle excision  SURGEON: Rosealee Albee, MD   ASSISTANT: none   ANESTHESIA: Gen with Exparel interscalene block   ESTIMATED BLOOD LOSS: 5cc   DRAINS:  none   TOTAL IV FLUIDS: per anesthesia      SPECIMENS: none   IMPLANTS:  - Arthrex 2.2mm PushLock x 2 - Arthrex 4.55mm SwiveLock x 2 - Iconix SPEED double loaded with 1.2 and 2.44mm tape x 2     OPERATIVE FINDINGS:  Examination under anesthesia: A careful examination under anesthesia was performed.  Passive range of motion was: FF: 150; ER at side: 80; ER in abduction: 120; IR in abduction: 45.  Anterior load shift: NT.  Posterior load shift: NT.  Sulcus in neutral: NT.  Sulcus in ER: NT.     Intra-operative findings: A thorough arthroscopic examination of the shoulder was performed.  The findings are: 1. Biceps tendon: tendinopathy with significant erythema and split-thickness tearing  2. Superior labrum: erythema 3. Posterior labrum and capsule: normal 4. Inferior capsule and inferior recess: normal 5. Glenoid cartilage surface: Normal 6. Supraspinatus attachment: full-thickness tear of the anterior supraspinatus 7. Posterior rotator cuff attachment: normal 8. Humeral head articular cartilage: normal 9. Rotator interval: significant synovitis 10: Subscapularis tendon: attachment intact 11. Anterior labrum: Mildly  degenerative 12. IGHL: normal   OPERATIVE REPORT:    Indications for procedure:  Tamara Flowers is a 52 y.o. female with approximately 6 weeks of right shoulder pain after she felt significant pain after walking her dog.  She has had difficulty lifting her arm overhead since that time with sensations of weakness. Clinical exam and MRI were suggestive of rotator cuff tear, biceps tendinopathy, acromioclavicular joint arthritis and subacromial impingement. After discussion of risks, benefits, and alternatives to surgery, the patient elected to proceed.    Procedure in detail:   I identified Tamara Flowers in the pre-operative holding area.  I marked the operative shoulder with my initials. I reviewed the risks and benefits of the proposed surgical intervention, and the patient wished to proceed.  Anesthesia was then performed with an Exparel interscalene block.  The patient was transferred to the operative suite and placed in the beach chair position.     Appropriate IV antibiotics were administered prior to incision. The operative upper extremity was then prepped and draped in standard fashion. A time out was performed confirming the correct extremity, correct patient, and correct procedure.    I then created a standard posterior portal with an 11 blade. The glenohumeral joint was easily entered with a blunt trocar and the arthroscope introduced. The findings of diagnostic arthroscopy are described above. I debrided degenerative tissue including the synovitic tissue about the rotator interval and anterior and superior labrum. I then coagulated the inflamed synovium to obtain hemostasis and reduce the risk of post-operative swelling using an Arthrocare radiofrequency device.   I then turned my attention to the arthroscopic biceps tenodesis.  The torn edges of the biceps tendon were debrided with an oscillating shaver.  The Loop n Tack  technique was used to pass a FiberTape through the biceps in a  locked fashion adjacent to the biceps anchor.  A hole for a 2.9 mm Arthrex PushLock was drilled in the bicipital groove just superior to the subscapularis tendon insertion.  The biceps tendon was then cut and the biceps anchor complex was debrided down to a stable base on the superior labrum.  The FiberTape was loaded onto the PushLock anchor and impacted into place into the previously drilled hole in the bicipital groove.  This appropriately secured the biceps into the bicipital groove and took it off of tension.   Next, the arthroscope was then introduced into the subacromial space. A direct lateral portal was created with an 11-blade after spinal needle localization. An extensive subacromial bursectomy and debridement was performed using a combination of the shaver and Arthrocare wand. The entire acromial undersurface was exposed and the CA ligament was subperiosteally elevated to expose the anterior acromial hook. A burr was used to create a flat anterior and lateral aspect of the acromion, converting it from a Type 2 to a Type 1 acromion. Care was made to keep the deltoid fascia intact.   I then turned my attention to the arthroscopic distal clavicle excision. I identified the acromioclavicular joint. Surrounding bursal tissue was debrided and the edges of the joint were identified. I used the 5.20mm barrel burr to remove the distal clavicle parallel to the edge of the acromion. I was able to fit two widths of the burr into the space between the distal clavicle and acromion, signifying that I had removed ~72mm of distal clavicle. This was confirmed by viewing anteriorly and introducing a probe with measuring marks from the lateral portal. Hemostasis was achieved with an Arthrocare wand.   Next, I created an accessory posterolateral portal to assist with visualization and instrumentation.  I debrided the poor quality edges of the supraspinatus tendon.  This was a U-shaped tear of the supraspinatus.  I  prepared the footprint using a burr to expose bleeding bone.     I then percutaneously placed 1 Iconix SPEED medial row anchor along the anterior portion of the tear at the articular margin. Another SPEED anchor was placed along the posterior portion of the tear at the articular margin. I then shuttled all 8 strands of tape through the rotator cuff just lateral to the musculotendinous junction using a FirstPass suture passer spanning the anterior to posterior extent of the tear. The posterior strands of each suture were passed through an [employer] anchor.  This was placed approximately 2 cm distal to the lateral edge of the footprint in line with the posterior aspect of the tear with appropriate tensioning of each suture prior to final fixation.  Similarly, the anterior strands of each suture were passed through another SwiveLock anchor along the anterior margin of the tear.  There was a central dogear.  A suture tape was passed in a mattress fashion through the dogear and the sutures were loaded onto a push lock anchor, which was impacted posteriorly to the posterior SwiveLock. This construct allowed for excellent reapproximation of the rotator cuff to its native footprint without undue tension.  Appropriate compression was achieved.  The repair was stable to external and internal rotation.   Fluid was evacuated from the shoulder, and the portals were closed with 3-0 Nylon. Xeroform was applied to the portals. A sterile dressing was applied, followed by a Polar Care sleeve and a SlingShot shoulder immobilizer/sling. The patient was awakened  from anesthesia without difficulty and was transferred to the PACU in stable condition.   COMPLICATIONS: none   DISPOSITION: plan for discharge home after recovery in PACU     POSTOPERATIVE PLAN: Remain in sling (except hygiene and elbow/wrist/hand RoM exercises as instructed by PT) x 6 weeks and NWB for this time. PT to begin 3-4 days after surgery.  Large  rotator cuff repair rehab protocol. ASA 325mg  daily x 2 weeks for DVT ppx.

## 2022-10-05 NOTE — Transfer of Care (Signed)
Immediate Anesthesia Transfer of Care Note  Patient: Tamara Flowers  Procedure(s) Performed: Right shoulder arthroscopic rotator cuff repair, subacromial decompression, and biceps tenodesis, distal clavicle excision (Right: Shoulder)  Patient Location: PACU  Anesthesia Type: General ETT  Level of Consciousness: awake, alert  and patient cooperative  Airway and Oxygen Therapy: Patient Spontanous Breathing and Patient connected to supplemental oxygen  Post-op Assessment: Post-op Vital signs reviewed, Patient's Cardiovascular Status Stable, Respiratory Function Stable, Patent Airway and No signs of Nausea or vomiting  Post-op Vital Signs: Reviewed and stable  Complications: No notable events documented.

## 2022-10-08 NOTE — Anesthesia Postprocedure Evaluation (Signed)
Anesthesia Post Note  Patient: Tamara Flowers  Procedure(s) Performed: Right shoulder arthroscopic rotator cuff repair, subacromial decompression, and biceps tenodesis, distal clavicle excision (Right: Shoulder)  Patient location during evaluation: PACU Anesthesia Type: General Level of consciousness: awake and alert Pain management: pain level controlled Vital Signs Assessment: post-procedure vital signs reviewed and stable Respiratory status: spontaneous breathing, nonlabored ventilation, respiratory function stable and patient connected to nasal cannula oxygen Cardiovascular status: blood pressure returned to baseline and stable Postop Assessment: no apparent nausea or vomiting Anesthetic complications: no   No notable events documented.   Last Vitals:  Vitals:   10/05/22 1210 10/05/22 1213  BP:  109/75  Pulse: (!) 108 (!) 109  Resp: (!) 35 (!) 27  Temp:  (!) 36.2 C  SpO2: 94% 95%    Last Pain:  Vitals:   10/05/22 1213  TempSrc:   PainSc: 6                  Tin Engram C Cameren Odwyer

## 2023-03-27 DIAGNOSIS — R7303 Prediabetes: Secondary | ICD-10-CM | POA: Insufficient documentation

## 2023-06-13 DIAGNOSIS — R42 Dizziness and giddiness: Secondary | ICD-10-CM | POA: Insufficient documentation

## 2023-06-13 DIAGNOSIS — R112 Nausea with vomiting, unspecified: Secondary | ICD-10-CM | POA: Insufficient documentation

## 2023-06-20 DIAGNOSIS — M1992 Post-traumatic osteoarthritis, unspecified site: Secondary | ICD-10-CM | POA: Insufficient documentation

## 2023-09-18 ENCOUNTER — Ambulatory Visit (INDEPENDENT_AMBULATORY_CARE_PROVIDER_SITE_OTHER)

## 2023-09-18 ENCOUNTER — Ambulatory Visit
Admission: RE | Admit: 2023-09-18 | Discharge: 2023-09-18 | Disposition: A | Discharge status: Home / Self Care | Source: Ambulatory Visit

## 2023-09-18 VITALS — BP 117/82 | HR 92 | Temp 99.2°F | Resp 16 | Ht 64.0 in | Wt 225.0 lb

## 2023-09-18 DIAGNOSIS — J069 Acute upper respiratory infection, unspecified: Secondary | ICD-10-CM | POA: Diagnosis not present

## 2023-09-18 MED ORDER — PROMETHAZINE-DM 6.25-15 MG/5ML PO SYRP
5.0000 mL | ORAL_SOLUTION | Freq: Four times a day (QID) | ORAL | 0 refills | Status: DC | PRN
Start: 2023-09-18 — End: 2023-11-16

## 2023-09-18 MED ORDER — AZELASTINE HCL 0.1 % NA SOLN: 1.0000 | Freq: Two times a day (BID) | NASAL | 1 refills | Status: AC

## 2023-09-18 NOTE — ED Triage Notes (Signed)
 Pt c/o cough,congestion x1 wk. & fever since this AM. Tmax 100 today. Has tried OTC meds w/o relief.

## 2023-09-18 NOTE — ED Provider Notes (Signed)
 [facility]  Note:  This document was prepared using Conservation officer, historic buildings and may include unintentional dictation errors.  MRN: [id number] DOB: [birth year]  Subjective:   Tamara Flowers is a 53 y.o. female presenting for cough, nasal congestion, body aches x 1 week with new onset fever this morning of 100 degrees.  Patient reports using over-the-counter cough and cold medication with minimal improvement.  Patient denies any chest pain, shortness of breath, weakness, dizziness.  Patient denies any known sick contacts.  Patient states that initially she thought her symptoms were attributed to seasonal allergies but did not seem to be improving and now with the fever seem to be escalating.  No current facility-administered medications for this encounter.  Current Outpatient Medications:    acetaminophen  (TYLENOL ) 500 MG tablet, Take 2 tablets (1,000 mg total) by mouth every 8 (eight) hours., Disp: 90 tablet, Rfl: 2   Ascorbic Acid (VITAMIN C PO), Take 1 capsule by mouth daily., Disp: , Rfl:    azelastine  (ASTELIN ) 0.1 % nasal spray, Place 1 spray into both nostrils 2 (two) times daily. Use in each nostril as directed, Disp: 30 mL, Rfl: 1   fexofenadine (ALLEGRA) 180 MG tablet, Take 180 mg by mouth daily as needed for allergies. , Disp: , Rfl:    fluticasone (FLONASE) 50 MCG/ACT nasal spray, Place 1 spray into both nostrils daily as needed for allergies or rhinitis. , Disp: , Rfl:    hydrochlorothiazide (HYDRODIURIL) 12.5 MG tablet, Take 12.5 mg by mouth daily as needed (swelling). , Disp: , Rfl:    linaclotide  (LINZESS ) 290 MCG CAPS capsule, TAKE 1 CAPSULE(290 MCG) BY MOUTH DAILY BEFORE BREAKFAST, Disp: 90 capsule, Rfl: 1   Melatonin 10 MG CAPS, Take 10 mg by mouth at bedtime., Disp: , Rfl:    metformin (FORTAMET) 500 MG (OSM) 24 hr tablet, Take 500 mg by mouth daily with breakfast., Disp: , Rfl:    Multiple Vitamin (MULTIVITAMIN WITH MINERALS) TABS tablet, Take 1 tablet by  mouth daily., Disp: , Rfl:    Olopatadine HCl (PATADAY OP), Place 1 drop into both eyes daily as needed (allergies). , Disp: , Rfl:    Omega-3 Fatty Acids (FISH OIL PO), Take 1 capsule by mouth daily., Disp: , Rfl:    oxyCODONE  (ROXICODONE ) 5 MG immediate release tablet, Take 1-2 tablets (5-10 mg total) by mouth every 4 (four) hours as needed (pain)., Disp: 30 tablet, Rfl: 0   pantoprazole (PROTONIX) 40 MG tablet, Take 40 mg by mouth daily as needed (acid reflux). , Disp: , Rfl:    promethazine -dextromethorphan (PROMETHAZINE -DM) 6.25-15 MG/5ML syrup, Take 5 mLs by mouth 4 (four) times daily as needed for cough., Disp: 118 mL, Rfl: 0   Semaglutide, 1 MG/DOSE, (OZEMPIC, 1 MG/DOSE,) 2 MG/1.5ML SOPN, Inject into the skin., Disp: , Rfl:    VITAMIN D PO, Take 1 capsule by mouth daily., Disp: , Rfl:    Allergies  Allergen Reactions   Sulfa Antibiotics Nausea And Vomiting, Dermatitis and Nausea Only   Sulfasalazine Nausea And Vomiting and Nausea Only    Other reaction(s): Vomiting    Past Medical History:  Diagnosis Date   Arthritis    knees, hips, shoulders   BPPV (benign paroxysmal positional vertigo) 11/2018   1 episode only   Complication of anesthesia    itching, and slow to wake   COVID-19 02/05/2019   Diabetes mellitus without complication (HCC)    Pre-Diabetic before weight loss   Edema 01/05/2015   Family  history of adverse reaction to anesthesia    Mother and siblings - itching and slow to wake   Fatigue 01/05/2015   GERD (gastroesophageal reflux disease) 01/05/2015   Hemorrhoids    History of kidney stones    Hyperlipidemia 01/05/2015   Hypertension 01/05/2015   NOT CURRENTLY   Migraine headache    none in approx 2 yrs   Obesity 01/05/2015   PONV (postoperative nausea and vomiting)    Pre-diabetes    Thyroid disease    Vitamin D deficiency 01/05/2015     Past Surgical History:  Procedure Laterality Date   CHOLECYSTECTOMY     COLONOSCOPY WITH PROPOFOL  N/A 05/14/2019    Procedure: COLONOSCOPY WITH PROPOFOL ;  Surgeon: Selena Daily, MD;  Location: [facility];  Service: Endoscopy;  Laterality: N/A;   KNEE ARTHROSCOPY WITH MEDIAL MENISECTOMY Right 03/28/2020   Procedure: Right partial medial meniscectomy;  Surgeon: Lorri Rota, MD;  Location: [facility];  Service: Orthopedics;  Laterality: Right;   NASAL SEPTUM SURGERY  1990s   NASAL SINUS SURGERY  2017 OR 2018   IN OFFICE   TONSILLECTOMY     TUBAL LIGATION     VAGINAL HYSTERECTOMY      Family History  Problem Relation Age of Onset   Cancer Mother    Diabetes Mother    Hypertension Father    Heart disease Father    Diabetes Sister    Diabetes Brother    Lupus Sister    Breast cancer Neg Hx     Social History   Tobacco Use   Smoking status: Never   Smokeless tobacco: Never  Vaping Use   Vaping status: Never Used  Substance Use Topics   Alcohol use: Yes    Alcohol/week: 0.0 standard drinks of alcohol    Comment: rare consumption - "Holidays"   Drug use: No    ROS Refer to HPI for ROS details.  Objective:   Vitals: BP 117/82 (BP Location: Right Arm)   Pulse 92   Temp 99.2 F (37.3 C) (Oral)   Resp 16   Ht 5\' 4"  (1.626 m)   Wt 225 lb (102.1 kg)   SpO2 97%   BMI 38.62 kg/m   Physical Exam Vitals and nursing note reviewed.  Constitutional:      General: She is not in acute distress.    Appearance: Normal appearance. She is well-developed. She is not ill-appearing or toxic-appearing.  HENT:     Head: Normocephalic and atraumatic.     Right Ear: Tympanic membrane, ear canal and external ear normal.     Left Ear: Tympanic membrane, ear canal and external ear normal.     Nose: Congestion present. No rhinorrhea.     Mouth/Throat:     Mouth: Mucous membranes are moist.     Pharynx: Oropharynx is clear. No oropharyngeal exudate or posterior oropharyngeal erythema.  Eyes:     General:        Right eye: No discharge.        Left eye: No discharge.     Extraocular  Movements: Extraocular movements intact.     Conjunctiva/sclera: Conjunctivae normal.  Cardiovascular:     Rate and Rhythm: Normal rate and regular rhythm.     Heart sounds: Normal heart sounds. No murmur heard. Pulmonary:     Effort: Pulmonary effort is normal. No respiratory distress.     Breath sounds: Normal breath sounds. No stridor. No wheezing, rhonchi or rales.  Skin:    General:  Skin is warm and dry.  Neurological:     General: No focal deficit present.     Mental Status: She is alert and oriented to person, place, and time.  Psychiatric:        Mood and Affect: Mood normal.        Behavior: Behavior normal.     Procedures  No results found for this or any previous visit (from the past 24 hours).  Assessment and Plan :     Discharge Instructions       1. Viral URI with cough (Primary) - DG Chest 2 View x-ray performed in UC shows no acute cardiopulmonary processes, no sign of consolidation or pneumonia. - azelastine (ASTELIN) 0.1 % nasal spray; Place 1 spray into both nostrils 2 (two) times daily. Use in each nostril as directed  Dispense: 30 mL; Refill: 1 - promethazine -dextromethorphan (PROMETHAZINE -DM) 6.25-15 MG/5ML syrup; Take 5 mLs by mouth 4 (four) times daily as needed for cough.  Dispense: 118 mL; Refill: 0 - Take ibuprofen  or Tylenol  as needed for fever and/or muscle aches secondary to viral illness -Continue to monitor symptoms for any change in severity if there is any escalation of current symptoms or development of new symptoms follow-up in ER for further evaluation and management.    Lollie Gunner B Zelig Gacek   Kaetlyn Noa, [place] B, [place] 09/18/23 1542

## 2023-09-18 NOTE — Discharge Instructions (Addendum)
  1. Viral URI with cough (Primary) - DG Chest 2 View x-ray performed in UC shows no acute cardiopulmonary processes, no sign of consolidation or pneumonia. - azelastine (ASTELIN) 0.1 % nasal spray; Place 1 spray into both nostrils 2 (two) times daily. Use in each nostril as directed  Dispense: 30 mL; Refill: 1 - promethazine -dextromethorphan (PROMETHAZINE -DM) 6.25-15 MG/5ML syrup; Take 5 mLs by mouth 4 (four) times daily as needed for cough.  Dispense: 118 mL; Refill: 0 - Take ibuprofen  or Tylenol  as needed for fever and/or muscle aches secondary to viral illness -Continue to monitor symptoms for any change in severity if there is any escalation of current symptoms or development of new symptoms follow-up in ER for further evaluation and management.

## 2023-09-19 ENCOUNTER — Ambulatory Visit (HOSPITAL_COMMUNITY): Payer: Self-pay

## 2023-11-16 ENCOUNTER — Ambulatory Visit
Admission: EM | Admit: 2023-11-16 | Discharge: 2023-11-16 | Disposition: A | Discharge status: Home / Self Care | Attending: Family Medicine | Admitting: Family Medicine

## 2023-11-16 ENCOUNTER — Encounter: Payer: Self-pay | Admitting: Emergency Medicine

## 2023-11-16 DIAGNOSIS — J069 Acute upper respiratory infection, unspecified: Secondary | ICD-10-CM

## 2023-11-16 DIAGNOSIS — J329 Chronic sinusitis, unspecified: Secondary | ICD-10-CM | POA: Diagnosis not present

## 2023-11-16 DIAGNOSIS — J40 Bronchitis, not specified as acute or chronic: Secondary | ICD-10-CM

## 2023-11-16 MED ORDER — PROMETHAZINE-DM 6.25-15 MG/5ML PO SYRP: 5.0000 mL | ORAL_SOLUTION | Freq: Four times a day (QID) | ORAL | 0 refills | Status: DC | PRN

## 2023-11-16 MED ORDER — PREDNISONE 50 MG PO TABS: 50.0000 mg | ORAL_TABLET | Freq: Every day | ORAL | 0 refills | Status: AC

## 2023-11-16 MED ORDER — CEFDINIR 300 MG PO CAPS: 300.0000 mg | ORAL_CAPSULE | Freq: Two times a day (BID) | ORAL | 0 refills | Status: DC

## 2023-11-16 NOTE — ED Provider Notes (Signed)
 [facility]    CSN: [id number] Arrival date & time: 11/16/23  9083      History   Chief Complaint Chief Complaint  Patient presents with   Cough   Nasal Congestion    HPI Tamara Flowers is a 53 y.o. female.   HPI  History obtained from the patient. Tamara Flowers presents for productive cough and nasal congestion with headache for the past week or more.  Has terrible headache.  Tried OTC sinus and allergy medicine, Theraflu and Mucinex .  Has been taking Naproxen and ibuprofen  for her headache and back pain.  She vomited yesterday after a coughing fit.  No fever or diarrhea.  No known sick contacts.   No history of asthma. Denies vaping and smoking.      Past Medical History:  Diagnosis Date   Arthritis    knees, hips, shoulders   BPPV (benign paroxysmal positional vertigo) 11/2018   1 episode only   Complication of anesthesia    itching, and slow to wake   COVID-19 02/05/2019   Diabetes mellitus without complication (HCC)    Pre-Diabetic before weight loss   Edema 01/05/2015   Family history of adverse reaction to anesthesia    Mother and siblings - itching and slow to wake   Fatigue 01/05/2015   GERD (gastroesophageal reflux disease) 01/05/2015   Hemorrhoids    History of kidney stones    Hyperlipidemia 01/05/2015   Hypertension 01/05/2015   NOT CURRENTLY   Migraine headache    none in approx 2 yrs   Obesity 01/05/2015   PONV (postoperative nausea and vomiting)    Pre-diabetes    Thyroid disease    Vitamin D deficiency 01/05/2015    Patient Active Problem List   Diagnosis Date Noted   Family history of colon cancer in mother    DDD (degenerative disc disease), lumbar 12/05/2016   Greater trochanteric bursitis, right 12/05/2016   Right knee pain 12/05/2016   Hemorrhoids 07/13/2016   Arthralgia of multiple joints 02/08/2016   Chronic hip pain, right 02/08/2016   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   Morbid obesity  with BMI of 45.0-49.9, adult (HCC) 02/08/2016   Hyperglycemia 01/05/2015   Hyperlipidemia 01/05/2015   Hypertension 01/05/2015   Vitamin D deficiency 01/05/2015   GERD (gastroesophageal reflux disease) 01/05/2015   Obesity 01/05/2015   Edema 01/05/2015    Past Surgical History:  Procedure Laterality Date   CHOLECYSTECTOMY     COLONOSCOPY WITH PROPOFOL  N/A 05/14/2019   Procedure: COLONOSCOPY WITH PROPOFOL ;  Surgeon: Unk Corinn Skiff, MD;  Location: [facility];  Service: Endoscopy;  Laterality: N/A;   KNEE ARTHROSCOPY WITH MEDIAL MENISECTOMY Right 03/28/2020   Procedure: Right partial medial meniscectomy;  Surgeon: Tobie Priest, MD;  Location: [facility];  Service: Orthopedics;  Laterality: Right;   NASAL SEPTUM SURGERY  1990s   NASAL SINUS SURGERY  2017 OR 2018   IN OFFICE   TONSILLECTOMY     TUBAL LIGATION     VAGINAL HYSTERECTOMY      OB History   No obstetric history on file.      Home Medications    Prior to Admission medications   Medication Sig Start Date End Date Taking? Authorizing Provider  Ascorbic Acid (VITAMIN C PO) Take 1 capsule by mouth daily.   Yes [provider]  cyclobenzaprine (FLEXERIL) 10 MG tablet Take 10 mg by mouth. 06/30/03  Yes [provider]  fexofenadine (ALLEGRA) 180 MG tablet  Take 180 mg by mouth daily as needed for allergies.  01/28/14  Yes [provider]  fluticasone (FLONASE) 50 MCG/ACT nasal spray Place 1 spray into both nostrils daily as needed for allergies or rhinitis.    Yes [provider]  hydrochlorothiazide (HYDRODIURIL) 12.5 MG tablet Take 12.5 mg by mouth daily as needed (swelling).  05/26/19  Yes [provider]  linaclotide  (LINZESS ) 290 MCG CAPS capsule TAKE 1 CAPSULE(290 MCG) BY MOUTH DAILY BEFORE BREAKFAST 11/29/20  Yes Vanga, Corinn Skiff, MD  Melatonin 10 MG CAPS Take 10 mg by mouth at bedtime.   Yes [provider]  Multiple Vitamin (MULTIVITAMIN WITH MINERALS) TABS  tablet Take 1 tablet by mouth daily.   Yes [provider]  naproxen (NAPROSYN) 500 MG tablet Take 500 mg by mouth. 10/16/23  Yes [provider]  Olopatadine HCl (PATADAY OP) Place 1 drop into both eyes daily as needed (allergies).    Yes [provider]  Omega-3 Fatty Acids (FISH OIL PO) Take 1 capsule by mouth daily.   Yes [provider]  pantoprazole (PROTONIX) 40 MG tablet Take 40 mg by mouth daily as needed (acid reflux).  05/13/19  Yes [provider]  VITAMIN D PO Take 1 capsule by mouth daily.   Yes [provider]  azelastine  (ASTELIN ) 0.1 % nasal spray Place 1 spray into both nostrils 2 (two) times daily. Use in each nostril as directed 09/18/23   Reddick, Johnathan B, NP  metformin (FORTAMET) 500 MG (OSM) 24 hr tablet Take 500 mg by mouth daily with breakfast.    [provider]  promethazine -dextromethorphan (PROMETHAZINE -DM) 6.25-15 MG/5ML syrup Take 5 mLs by mouth 4 (four) times daily as needed for cough. 09/18/23   Reddick, Johnathan B, NP  Semaglutide, 1 MG/DOSE, (OZEMPIC, 1 MG/DOSE,) 2 MG/1.5ML SOPN Inject into the skin.    [provider]    Family History Family History  Problem Relation Age of Onset   Cancer Mother    Diabetes Mother    Hypertension Father    Heart disease Father    Diabetes Sister    Diabetes Brother    Lupus Sister    Breast cancer Neg Hx     Social History Social History   Tobacco Use   Smoking status: Never   Smokeless tobacco: Never  Vaping Use   Vaping status: Never Used  Substance Use Topics   Alcohol use: Yes    Alcohol/week: 0.0 standard drinks of alcohol    Comment: rare consumption - Holidays   Drug use: No     Allergies   Sulfa antibiotics and Sulfasalazine   Review of Systems Review of Systems: negative unless otherwise stated in HPI.      Physical Exam Triage Vital Signs ED Triage Vitals  Encounter Vitals Group     BP 11/16/23 1007 98/83      Girls Systolic BP Percentile --      Girls Diastolic BP Percentile --      Boys Systolic BP Percentile --      Boys Diastolic BP Percentile --      Pulse Rate 11/16/23 1007 97     Resp 11/16/23 1007 18     Temp 11/16/23 1007 98.5 F (36.9 C)     Temp Source 11/16/23 1007 Oral     SpO2 11/16/23 1007 97 %     Weight 11/16/23 1005 225 lb 1.4 oz (102.1 kg)     Height 11/16/23 1005 5' 4 (  1.626 m)     Head Circumference --      Peak Flow --      Pain Score 11/16/23 1005 0     Pain Loc --      Pain Education --      Exclude from Growth Chart --    No data found.  Updated Vital Signs BP 98/83 (BP Location: Right Arm)   Pulse 97   Temp 98.5 F (36.9 C) (Oral)   Resp 18   Ht 5' 4 (1.626 m)   Wt 102.1 kg   SpO2 97%   BMI 38.64 kg/m   Visual Acuity Right Eye Distance:   Left Eye Distance:   Bilateral Distance:    Right Eye Near:   Left Eye Near:    Bilateral Near:     Physical Exam GEN:     alert, non-toxic appearing female in no distress ***   HENT:  mucus membranes moist, oropharyngeal ***without lesions or ***erythema, no*** tonsillar hypertrophy or exudates, *** moderate erythematous edematous turbinates, ***clear nasal discharge, ***bilateral TM normal EYES:   pupils equal and reactive, ***no scleral injection or discharge NECK:  normal ROM, no ***lymphadenopathy, ***no meningismus   RESP:  no increased work of breathing, ***clear to auscultation bilaterally CVS:   regular rate ***and rhythm Skin:   warm and dry, no rash on visible skin***    UC Treatments / Results  Labs (all labs ordered are listed, but only abnormal results are displayed) Labs Reviewed - No data to display  EKG   Radiology No results found.  Procedures Procedures (including critical care time)  Medications Ordered in UC Medications - No data to display  Initial Impression / Assessment and Plan / UC Course  I have reviewed the triage vital signs and the nursing notes.  Pertinent  labs & imaging results that were available during my care of the patient were reviewed by me and considered in my medical decision making (see chart for details).       Pt is a 54 y.o. female who presents for *** days of respiratory symptoms. Tamara Flowers is ***afebrile here without recent antipyretics. Satting well on room air. Overall pt is ***non-toxic appearing, well hydrated, without respiratory distress. Pulmonary exam ***is unremarkable.  COVID and influenza panel obtained ***and was negative. ***Pt to quarantine until COVID test results or longer if positive.  I will call patient with test results, if positive. History consistent with ***viral respiratory illness. Discussed symptomatic treatment.  Explained lack of efficacy of antibiotics in viral disease.  Typical duration of symptoms discussed.   Return and ED precautions given and voiced understanding. Discussed MDM, treatment plan and plan for follow-up with patient*** who agrees with plan.     Final Clinical Impressions(s) / UC Diagnoses   Final diagnoses:  None   Discharge Instructions   None    ED Prescriptions   None    PDMP not reviewed this encounter.

## 2023-11-16 NOTE — Discharge Instructions (Signed)
 Stop by the pharmacy to pick up your prescriptions.  Follow up with your primary care provider or return to the urgent care, if not improving.

## 2023-11-16 NOTE — ED Triage Notes (Signed)
 Pt c/o cough, nasal congestion, shortness of breath, and headache. Started about a week ago. Denies fever.

## 2023-12-09 DIAGNOSIS — M4316 Spondylolisthesis, lumbar region: Secondary | ICD-10-CM | POA: Insufficient documentation

## 2023-12-09 DIAGNOSIS — M5416 Radiculopathy, lumbar region: Secondary | ICD-10-CM | POA: Insufficient documentation

## 2023-12-25 ENCOUNTER — Other Ambulatory Visit: Payer: Self-pay | Admitting: Physician Assistant

## 2023-12-25 ENCOUNTER — Ambulatory Visit
Admission: RE | Admit: 2023-12-25 | Discharge: 2023-12-25 | Disposition: A | Discharge status: Home / Self Care | Source: Ambulatory Visit | Attending: Physician Assistant | Admitting: Physician Assistant

## 2023-12-25 DIAGNOSIS — M7989 Other specified soft tissue disorders: Secondary | ICD-10-CM | POA: Diagnosis present

## 2023-12-25 DIAGNOSIS — M79604 Pain in right leg: Secondary | ICD-10-CM | POA: Insufficient documentation

## 2024-01-30 ENCOUNTER — Other Ambulatory Visit: Payer: Self-pay | Admitting: Physician Assistant

## 2024-01-30 DIAGNOSIS — Z1231 Encounter for screening mammogram for malignant neoplasm of breast: Secondary | ICD-10-CM

## 2024-03-04 DIAGNOSIS — K921 Melena: Secondary | ICD-10-CM | POA: Insufficient documentation

## 2024-03-04 DIAGNOSIS — K5792 Diverticulitis of intestine, part unspecified, without perforation or abscess without bleeding: Secondary | ICD-10-CM | POA: Insufficient documentation

## 2024-03-12 ENCOUNTER — Telehealth: Payer: Self-pay

## 2024-03-12 NOTE — Telephone Encounter (Signed)
 Gastroenterology Pre-Procedure Review  Request Date: Office Visit Scheduled with Grayce, NP  Requesting Physician: Dr.   PATIENT REVIEW QUESTIONS: The patient responded to the following health history questions as indicated:    1. Are you having any GI issues? yes (colitis, diverticulitis, blood in stool) 2. Do you have a personal history of Polyps? no. Last colonoscopy 05/14/19 no polyps 3. Do you have a family history of Colon Cancer or Polyps? yes (mother colon cancer) 4. Diabetes Mellitus? Prediabetic controlled with diet 5. Joint replacements in the past 12 months?no 6. Major health problems in the past 3 months?no 7. Any artificial heart valves, MVP, or defibrillator?no    MEDICATIONS & ALLERGIES:    Patient reports the following regarding taking any anticoagulation/antiplatelet therapy:   Plavix, Coumadin, Eliquis, Xarelto, Lovenox, Pradaxa, Brilinta, or Effient? no Aspirin ? no  Patient confirms/reports the following medications:  Current Outpatient Medications  Medication Sig Dispense Refill   Ascorbic Acid (VITAMIN C PO) Take 1 capsule by mouth daily.     azelastine  (ASTELIN ) 0.1 % nasal spray Place 1 spray into both nostrils 2 (two) times daily. Use in each nostril as directed 30 mL 1   cefdinir  (OMNICEF ) 300 MG capsule Take 1 capsule (300 mg total) by mouth 2 (two) times daily. 14 capsule 0   cyclobenzaprine (FLEXERIL) 10 MG tablet Take 10 mg by mouth.     fexofenadine (ALLEGRA) 180 MG tablet Take 180 mg by mouth daily as needed for allergies.      fluticasone (FLONASE) 50 MCG/ACT nasal spray Place 1 spray into both nostrils daily as needed for allergies or rhinitis.      hydrochlorothiazide (HYDRODIURIL) 12.5 MG tablet Take 12.5 mg by mouth daily as needed (swelling).      linaclotide  (LINZESS ) 290 MCG CAPS capsule TAKE 1 CAPSULE(290 MCG) BY MOUTH DAILY BEFORE BREAKFAST 90 capsule 1   Melatonin 10 MG CAPS Take 10 mg by mouth at bedtime.     metformin (FORTAMET) 500 MG (OSM)  24 hr tablet Take 500 mg by mouth daily with breakfast. (Patient not taking: Reported on 03/12/2024)     Multiple Vitamin (MULTIVITAMIN WITH MINERALS) TABS tablet Take 1 tablet by mouth daily.     naproxen (NAPROSYN) 500 MG tablet Take 500 mg by mouth.     Olopatadine HCl (PATADAY OP) Place 1 drop into both eyes daily as needed (allergies).      Omega-3 Fatty Acids (FISH OIL PO) Take 1 capsule by mouth daily.     pantoprazole (PROTONIX) 40 MG tablet Take 40 mg by mouth daily as needed (acid reflux).      promethazine -dextromethorphan (PROMETHAZINE -DM) 6.25-15 MG/5ML syrup Take 5 mLs by mouth 4 (four) times daily as needed for cough. 118 mL 0   Semaglutide, 1 MG/DOSE, (OZEMPIC, 1 MG/DOSE,) 2 MG/1.5ML SOPN Inject into the skin. (Patient not taking: Reported on 03/12/2024)     VITAMIN D PO Take 1 capsule by mouth daily.     No current facility-administered medications for this visit.    Patient confirms/reports the following allergies:  Allergies  Allergen Reactions   Sulfa Antibiotics Nausea And Vomiting, Dermatitis and Nausea Only   Sulfasalazine Nausea And Vomiting and Nausea Only    Other reaction(s): Vomiting    No orders of the defined types were placed in this encounter.   AUTHORIZATION INFORMATION Primary Insurance: 1D#: Group #:  Secondary Insurance: 1D#: Group #:  SCHEDULE INFORMATION: Date: Office visit scheduled 04/29/24 Time: Location:

## 2024-04-14 ENCOUNTER — Encounter: Payer: Self-pay | Admitting: Emergency Medicine

## 2024-04-14 ENCOUNTER — Ambulatory Visit
Admission: EM | Admit: 2024-04-14 | Discharge: 2024-04-14 | Disposition: A | Discharge status: Home / Self Care | Attending: Emergency Medicine | Admitting: Emergency Medicine

## 2024-04-14 DIAGNOSIS — R051 Acute cough: Secondary | ICD-10-CM | POA: Diagnosis not present

## 2024-04-14 DIAGNOSIS — J069 Acute upper respiratory infection, unspecified: Secondary | ICD-10-CM | POA: Diagnosis not present

## 2024-04-14 LAB — POCT INFLUENZA A/B
Influenza A, POC: NEGATIVE
Influenza B, POC: NEGATIVE

## 2024-04-14 LAB — POC SOFIA SARS ANTIGEN FIA: SARS Coronavirus 2 Ag: NEGATIVE

## 2024-04-14 MED ORDER — IPRATROPIUM BROMIDE 0.06 % NA SOLN: 2.0000 | Freq: Four times a day (QID) | NASAL | 12 refills | Status: AC

## 2024-04-14 MED ORDER — PROMETHAZINE-DM 6.25-15 MG/5ML PO SYRP: 5.0000 mL | ORAL_SOLUTION | Freq: Four times a day (QID) | ORAL | 0 refills | Status: AC | PRN

## 2024-04-14 MED ORDER — AEROCHAMBER MV MISC: 2 refills | Status: AC

## 2024-04-14 MED ORDER — ALBUTEROL SULFATE HFA 108 (90 BASE) MCG/ACT IN AERS: 2.0000 | INHALATION_SPRAY | RESPIRATORY_TRACT | 0 refills | Status: AC | PRN

## 2024-04-14 MED ORDER — BENZONATATE 100 MG PO CAPS: 200.0000 mg | ORAL_CAPSULE | Freq: Three times a day (TID) | ORAL | 0 refills | Status: AC

## 2024-04-14 NOTE — Discharge Instructions (Addendum)
 Your testing today was negative for COVID and influenza.  However, based on your exam I feel that you have a viral respiratory infection causing your symptoms.  Use over-the-counter Tylenol  and/or ibuprofen  according to the package instructions as needed for any fever or pain.  Use the albuterol  inhaler, with the spacer, and take 1 to 2 puffs every 4-6 hours as needed for any shortness of breath or wheezing.  Use the Atrovent  nasal spray, 2 squirts in each nostril every 6 hours, as needed for runny nose and postnasal drip.  Use the Tessalon  Perles every 8 hours during the day.  Take them with a small sip of water .  They may give you some numbness to the base of your tongue or a metallic taste in your mouth, this is normal.  Use the Promethazine  DM cough syrup at bedtime for cough and congestion.  It will make you drowsy so do not take it during the day.  Return for reevaluation or see your primary care provider for any new or worsening symptoms.

## 2024-04-14 NOTE — ED Provider Notes (Signed)
 " [facility]    CSN: [id number] Arrival date & time: 04/14/24  1027      History   Chief Complaint Chief Complaint  Patient presents with   Cough    HPI MYELLE POTEAT is a 53 y.o. female.   HPI  53 year old female with past medical history significant for BPPV, arthritis, chronic midline low back pain, and obesity presents for evaluation of respiratory symptoms that began 2 days ago.  These include runny nose, nasal congestion, scratchy throat, and a productive cough of clear sputum along with some wheezing.  She denies any fever, ear pain, or shortness of breath.  She does have some pain on the left side of her chest with deep respiration only.  Past Medical History:  Diagnosis Date   Arthritis    knees, hips, shoulders   BPPV (benign paroxysmal positional vertigo) 11/2018   1 episode only   Complication of anesthesia    itching, and slow to wake   COVID-19 02/05/2019   Diabetes mellitus without complication (HCC)    Pre-Diabetic before weight loss   Edema 01/05/2015   Family history of adverse reaction to anesthesia    Mother and siblings - itching and slow to wake   Fatigue 01/05/2015   GERD (gastroesophageal reflux disease) 01/05/2015   Hemorrhoids    History of kidney stones    Hyperlipidemia 01/05/2015   Hypertension 01/05/2015   NOT CURRENTLY   Migraine headache    none in approx 2 yrs   Obesity 01/05/2015   PONV (postoperative nausea and vomiting)    Pre-diabetes    Thyroid disease    Vitamin D deficiency 01/05/2015    Patient Active Problem List   Diagnosis Date Noted   Family history of colon cancer in mother    DDD (degenerative disc disease), lumbar 12/05/2016   Greater trochanteric bursitis, right 12/05/2016   Right knee pain 12/05/2016   Hemorrhoids 07/13/2016   Arthralgia of multiple joints 02/08/2016   Chronic hip pain, right 02/08/2016   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   Morbid obesity  with BMI of 45.0-49.9, adult (HCC) 02/08/2016   Hyperglycemia 01/05/2015   Hyperlipidemia 01/05/2015   Hypertension 01/05/2015   Vitamin D deficiency 01/05/2015   GERD (gastroesophageal reflux disease) 01/05/2015   Obesity 01/05/2015   Edema 01/05/2015    Past Surgical History:  Procedure Laterality Date   CHOLECYSTECTOMY     COLONOSCOPY WITH PROPOFOL  N/A 05/14/2019   Procedure: COLONOSCOPY WITH PROPOFOL ;  Surgeon: Unk Corinn Skiff, MD;  Location: [facility];  Service: Endoscopy;  Laterality: N/A;   KNEE ARTHROSCOPY WITH MEDIAL MENISECTOMY Right 03/28/2020   Procedure: Right partial medial meniscectomy;  Surgeon: Tobie Priest, MD;  Location: [facility];  Service: Orthopedics;  Laterality: Right;   NASAL SEPTUM SURGERY  1990s   NASAL SINUS SURGERY  2017 OR 2018   IN OFFICE   TONSILLECTOMY     TUBAL LIGATION     VAGINAL HYSTERECTOMY      OB History   No obstetric history on file.      Home Medications    Prior to Admission medications  Medication Sig Start Date End Date Taking? Authorizing Provider  albuterol  (VENTOLIN  HFA) 108 (90 Base) MCG/ACT inhaler Inhale 2 puffs into the lungs every 4 (four) hours as needed. 04/14/24  Yes Bernardino Ditch, NP  benzonatate  (TESSALON ) 100 MG capsule Take 2 capsules (200 mg total) by mouth every 8 (eight) hours. 04/14/24  Yes Avrey Flanagin,  [place]et[place], NP  ipratropium (ATROVENT ) 0.06 % nasal spray Place 2 sprays into both nostrils 4 (four) times daily. 04/14/24  Yes Bernardino Venetia, NP  promethazine -dextromethorphan (PROMETHAZINE -DM) 6.25-15 MG/5ML syrup Take 5 mLs by mouth 4 (four) times daily as needed. 04/14/24  Yes Bernardino Venetia, NP  Spacer/Aero-Holding Chambers (AEROCHAMBER MV) inhaler Use as instructed 04/14/24  Yes Bernardino Venetia, NP  Ascorbic Acid (VITAMIN C PO) Take 1 capsule by mouth daily.    [provider]  azelastine  (ASTELIN ) 0.1 % nasal spray Place 1 spray into both nostrils 2 (two) times daily. Use in each nostril as directed  09/18/23   Reddick, Johnathan B, NP  cyclobenzaprine (FLEXERIL) 10 MG tablet Take 10 mg by mouth. 06/30/03   [provider]  fexofenadine (ALLEGRA) 180 MG tablet Take 180 mg by mouth daily as needed for allergies.  01/28/14   [provider]  fluticasone (FLONASE) 50 MCG/ACT nasal spray Place 1 spray into both nostrils daily as needed for allergies or rhinitis.     [provider]  hydrochlorothiazide (HYDRODIURIL) 12.5 MG tablet Take 12.5 mg by mouth daily as needed (swelling).  05/26/19   [provider]  linaclotide  (LINZESS ) 290 MCG CAPS capsule TAKE 1 CAPSULE(290 MCG) BY MOUTH DAILY BEFORE BREAKFAST 11/29/20   Vanga, Rohini Reddy, MD  Melatonin 10 MG CAPS Take 10 mg by mouth at bedtime.    [provider]  metformin (FORTAMET) 500 MG (OSM) 24 hr tablet Take 500 mg by mouth daily with breakfast. Patient not taking: Reported on 03/12/2024    [provider]  Multiple Vitamin (MULTIVITAMIN WITH MINERALS) TABS tablet Take 1 tablet by mouth daily.    [provider]  naproxen (NAPROSYN) 500 MG tablet Take 500 mg by mouth. 10/16/23   [provider]  Olopatadine HCl (PATADAY OP) Place 1 drop into both eyes daily as needed (allergies).     [provider]  Omega-3 Fatty Acids (FISH OIL PO) Take 1 capsule by mouth daily.    [provider]  pantoprazole (PROTONIX) 40 MG tablet Take 40 mg by mouth daily as needed (acid reflux).  05/13/19   [provider]  Semaglutide, 1 MG/DOSE, (OZEMPIC, 1 MG/DOSE,) 2 MG/1.5ML SOPN Inject into the skin. Patient not taking: Reported on 03/12/2024    [provider]  VITAMIN D PO Take 1 capsule by mouth daily.    [provider]    Family History Family History  Problem Relation Age of Onset   Cancer Mother    Diabetes Mother    Hypertension Father    Heart disease Father    Diabetes Sister    Diabetes Brother    Lupus Sister    Breast cancer Neg Hx      Social History Social History[1]   Allergies   Prednisone , Sulfa antibiotics, and Sulfasalazine   Review of Systems Review of Systems  Constitutional:  Negative for fever.  HENT:  Positive for congestion, rhinorrhea and sore throat. Negative for ear pain.   Respiratory:  Positive for cough and wheezing. Negative for shortness of breath.   Cardiovascular:  Positive for chest pain.       Left anterior chest wall with deep respiration only.     Physical Exam Triage Vital Signs ED Triage Vitals  Encounter Vitals Group     BP      Girls Systolic BP Percentile      Girls Diastolic BP Percentile      Boys Systolic BP Percentile  Boys Diastolic BP Percentile      Pulse      Resp      Temp      Temp src      SpO2      Weight      Height      Head Circumference      Peak Flow      Pain Score      Pain Loc      Pain Education      Exclude from Growth Chart    No data found.  Updated Vital Signs BP 137/78 (BP Location: Right Arm)   Pulse (S) (!) 128 Comment: baseline  Temp 98.8 F (37.1 C) (Oral)   Resp 16   SpO2 96%   Visual Acuity Right Eye Distance:   Left Eye Distance:   Bilateral Distance:    Right Eye Near:   Left Eye Near:    Bilateral Near:     Physical Exam Vitals and nursing note reviewed.  Constitutional:      Appearance: Normal appearance. She is not ill-appearing.  HENT:     Head: Normocephalic and atraumatic.     Right Ear: Tympanic membrane, ear canal and external ear normal. There is no impacted cerumen.     Left Ear: Tympanic membrane, ear canal and external ear normal. There is no impacted cerumen.     Nose: Congestion and rhinorrhea present.     Comments: This mucosa is erythematous and mildly edematous with scant clear discharge in both nares.    Mouth/Throat:     Mouth: Mucous membranes are moist.     Pharynx: Oropharynx is clear. Posterior oropharyngeal erythema present. No oropharyngeal exudate.     Comments: Mild erythema  to the posterior pharynx with clear postnasal drip. Cardiovascular:     Rate and Rhythm: Normal rate and regular rhythm.     Pulses: Normal pulses.     Heart sounds: Normal heart sounds. No murmur heard.    No friction rub. No gallop.  Pulmonary:     Effort: Pulmonary effort is normal.     Breath sounds: Normal breath sounds. No wheezing, rhonchi or rales.     Comments: Left anterior chest wall reproducible with palpation. Chest:     Chest wall: Tenderness present.  Musculoskeletal:     Cervical back: Normal range of motion and neck supple. No tenderness.  Lymphadenopathy:     Cervical: No cervical adenopathy.  Skin:    General: Skin is warm and dry.     Capillary Refill: Capillary refill takes less than 2 seconds.     Findings: No rash.  Neurological:     General: No focal deficit present.     Mental Status: She is alert and oriented to person, place, and time.      UC Treatments / Results  Labs (all labs ordered are listed, but only abnormal results are displayed) Labs Reviewed  POC SOFIA SARS ANTIGEN FIA - Normal  POCT INFLUENZA A/B - Normal    EKG   Radiology No results found.  Procedures Procedures (including critical care time)  Medications Ordered in UC Medications - No data to display  Initial Impression / Assessment and Plan / UC Course  I have reviewed the triage vital signs and the nursing notes.  Pertinent labs & imaging results that were available during my care of the patient were reviewed by me and considered in my medical decision making (see chart for details).   Patient is  a nontoxic-appearing 53 year old female presenting for evaluation 2 days with respiratory symptoms as outlined HPI above.  She reports that she and her son both have been sick with a cough but they are unaware of any other sick contacts no travel out of the state or country.  She came in because last night she developed pain underneath her left breast with deep respiration and  cough only.  Cardiopulmonary exam reveals S1-S2 heart sounds with regular rate and rhythm lung sounds are auscultation all fields.  Her pain in her left anterior chest wall is reproducible to palpation.  Differential diagnose include COVID, influenza, viral respiratory illness.  I will order a COVID and flu antigen test.  Influenza antigen test is negative.  COVID antigen test is negative.  I will discharge patient home with a diagnosis of viral URI with cough.  Also Atrovent  nasal spray for nasal congestion and Tessalon  Perles and Promethazine  DM cough syrup for cough and congestion.  She may use over-the-counter Tylenol  and/or ibuprofen  as needed for any fever or pain.  Return precautions reviewed.  I have also prescribed albuterol  inhaler and spacer that she can use every 4-6 hours as needed for any shortness breath or wheezing.   Final Clinical Impressions(s) / UC Diagnoses   Final diagnoses:  Acute cough  Viral URI with cough     Discharge Instructions      Your testing today was negative for COVID and influenza.  However, based on your exam I feel that you have a viral respiratory infection causing your symptoms.  Use over-the-counter Tylenol  and/or ibuprofen  according to the package instructions as needed for any fever or pain.  Use the albuterol  inhaler, with the spacer, and take 1 to 2 puffs every 4-6 hours as needed for any shortness of breath or wheezing.  Use the Atrovent  nasal spray, 2 squirts in each nostril every 6 hours, as needed for runny nose and postnasal drip.  Use the Tessalon  Perles every 8 hours during the day.  Take them with a small sip of water .  They may give you some numbness to the base of your tongue or a metallic taste in your mouth, this is normal.  Use the Promethazine  DM cough syrup at bedtime for cough and congestion.  It will make you drowsy so do not take it during the day.  Return for reevaluation or see your primary care provider for any new or  worsening symptoms.      ED Prescriptions     Medication Sig Dispense Auth. Provider   benzonatate  (TESSALON ) 100 MG capsule Take 2 capsules (200 mg total) by mouth every 8 (eight) hours. 21 capsule Bernardino Ditch, NP   ipratropium (ATROVENT ) 0.06 % nasal spray Place 2 sprays into both nostrils 4 (four) times daily. 15 mL Bernardino Ditch, NP   promethazine -dextromethorphan (PROMETHAZINE -DM) 6.25-15 MG/5ML syrup Take 5 mLs by mouth 4 (four) times daily as needed. 118 mL Bernardino Ditch, NP   Spacer/Aero-Holding Chambers (AEROCHAMBER MV) inhaler Use as instructed 1 each Bernardino Ditch, NP   albuterol  (VENTOLIN  HFA) 108 (90 Base) MCG/ACT inhaler Inhale 2 puffs into the lungs every 4 (four) hours as needed. 18 g Bernardino Ditch, NP      PDMP not reviewed this encounter.     [1]  Social History Tobacco Use   Smoking status: Never   Smokeless tobacco: Never  Vaping Use   Vaping status: Never Used  Substance Use Topics   Alcohol use: Yes    Alcohol/week: 0.0  standard drinks of alcohol    Comment: rare consumption - Holidays   Drug use: No     Bernardino Ditch, NP 04/14/24 1337  "

## 2024-04-14 NOTE — ED Triage Notes (Signed)
 Patient presents to UC for productive cough, chest congestion, wheezing x 2 days. Treating symptoms with OTC cough cold med.

## 2024-04-24 ENCOUNTER — Other Ambulatory Visit: Payer: Self-pay

## 2024-04-24 DIAGNOSIS — E538 Deficiency of other specified B group vitamins: Secondary | ICD-10-CM | POA: Insufficient documentation

## 2024-04-24 DIAGNOSIS — Z90710 Acquired absence of both cervix and uterus: Secondary | ICD-10-CM | POA: Insufficient documentation

## 2024-04-24 DIAGNOSIS — K589 Irritable bowel syndrome without diarrhea: Secondary | ICD-10-CM | POA: Insufficient documentation

## 2024-04-24 DIAGNOSIS — R7301 Impaired fasting glucose: Secondary | ICD-10-CM | POA: Insufficient documentation

## 2024-04-29 ENCOUNTER — Ambulatory Visit: Admitting: Family Medicine
